# Patient Record
Sex: Female | Born: 1955 | Race: White | Hispanic: No | Marital: Married | Smoking: Current some day smoker
Health system: Southern US, Community
[De-identification: ages and names within clinical notes are randomized; demographics above are authoritative.]

## PROBLEM LIST (undated history)

## (undated) DIAGNOSIS — I639 Cerebral infarction, unspecified: Secondary | ICD-10-CM

## (undated) DIAGNOSIS — E785 Hyperlipidemia, unspecified: Secondary | ICD-10-CM

## (undated) DIAGNOSIS — I739 Peripheral vascular disease, unspecified: Secondary | ICD-10-CM

## (undated) DIAGNOSIS — C50919 Malignant neoplasm of unspecified site of unspecified female breast: Secondary | ICD-10-CM

## (undated) DIAGNOSIS — Z720 Tobacco use: Secondary | ICD-10-CM

## (undated) DIAGNOSIS — I2510 Atherosclerotic heart disease of native coronary artery without angina pectoris: Secondary | ICD-10-CM

## (undated) HISTORY — PX: BELOW KNEE LEG AMPUTATION: SUR23

## (undated) HISTORY — DX: Peripheral vascular disease, unspecified: I73.9

## (undated) HISTORY — PX: FEMORAL-POPLITEAL BYPASS GRAFT: SHX937

## (undated) HISTORY — PX: MASTECTOMY: SHX3

## (undated) HISTORY — PX: TUBAL LIGATION: SHX77

## (undated) HISTORY — PX: PR VEIN BYPASS GRAFT,AORTO-FEM-POP: 35551

## (undated) HISTORY — DX: Atherosclerotic heart disease of native coronary artery without angina pectoris: I25.10

## (undated) HISTORY — PX: CHOLECYSTECTOMY: SHX55

## (undated) HISTORY — DX: Cerebral infarction, unspecified: I63.9

## (undated) HISTORY — DX: Tobacco use: Z72.0

## (undated) HISTORY — PX: HERNIA REPAIR: SHX51

## (undated) HISTORY — DX: Malignant neoplasm of unspecified site of unspecified female breast: C50.919

## (undated) HISTORY — DX: Hyperlipidemia, unspecified: E78.5

---

## 1998-09-17 ENCOUNTER — Ambulatory Visit (HOSPITAL_COMMUNITY): Admission: RE | Admit: 1998-09-17 | Discharge: 1998-09-17 | Payer: Self-pay | Admitting: Gastroenterology

## 1999-06-10 ENCOUNTER — Encounter: Payer: Self-pay | Admitting: Neurological Surgery

## 1999-06-10 ENCOUNTER — Encounter: Admission: RE | Admit: 1999-06-10 | Discharge: 1999-06-10 | Payer: Self-pay | Admitting: Neurological Surgery

## 1999-08-20 ENCOUNTER — Encounter (HOSPITAL_COMMUNITY): Payer: Self-pay | Admitting: Oncology

## 1999-08-20 ENCOUNTER — Ambulatory Visit (HOSPITAL_COMMUNITY): Admission: RE | Admit: 1999-08-20 | Discharge: 1999-08-20 | Payer: Self-pay | Admitting: Oncology

## 2000-09-07 ENCOUNTER — Ambulatory Visit (HOSPITAL_COMMUNITY): Admission: RE | Admit: 2000-09-07 | Discharge: 2000-09-07 | Payer: Self-pay | Admitting: Oncology

## 2000-09-07 ENCOUNTER — Encounter (HOSPITAL_COMMUNITY): Payer: Self-pay | Admitting: Oncology

## 2000-10-24 ENCOUNTER — Emergency Department (HOSPITAL_COMMUNITY): Admission: EM | Admit: 2000-10-24 | Discharge: 2000-10-24 | Payer: Self-pay | Admitting: Emergency Medicine

## 2000-11-18 ENCOUNTER — Ambulatory Visit (HOSPITAL_COMMUNITY): Admission: RE | Admit: 2000-11-18 | Discharge: 2000-11-18 | Payer: Self-pay | Admitting: *Deleted

## 2000-11-18 ENCOUNTER — Encounter (INDEPENDENT_AMBULATORY_CARE_PROVIDER_SITE_OTHER): Payer: Self-pay | Admitting: Specialist

## 2001-02-28 ENCOUNTER — Ambulatory Visit (HOSPITAL_COMMUNITY): Admission: RE | Admit: 2001-02-28 | Discharge: 2001-02-28 | Payer: Self-pay | Admitting: Oncology

## 2001-02-28 ENCOUNTER — Encounter (HOSPITAL_COMMUNITY): Payer: Self-pay | Admitting: Oncology

## 2001-03-07 ENCOUNTER — Encounter: Payer: Self-pay | Admitting: Gastroenterology

## 2001-03-07 ENCOUNTER — Ambulatory Visit (HOSPITAL_COMMUNITY): Admission: RE | Admit: 2001-03-07 | Discharge: 2001-03-07 | Payer: Self-pay | Admitting: Gastroenterology

## 2001-04-25 ENCOUNTER — Encounter: Payer: Self-pay | Admitting: Orthopaedic Surgery

## 2001-04-25 ENCOUNTER — Ambulatory Visit (HOSPITAL_COMMUNITY): Admission: RE | Admit: 2001-04-25 | Discharge: 2001-04-25 | Payer: Self-pay | Admitting: Orthopaedic Surgery

## 2002-03-01 ENCOUNTER — Encounter (HOSPITAL_COMMUNITY): Payer: Self-pay | Admitting: Oncology

## 2002-03-01 ENCOUNTER — Ambulatory Visit (HOSPITAL_COMMUNITY): Admission: RE | Admit: 2002-03-01 | Discharge: 2002-03-01 | Payer: Self-pay | Admitting: Oncology

## 2002-10-12 ENCOUNTER — Ambulatory Visit (HOSPITAL_COMMUNITY): Admission: RE | Admit: 2002-10-12 | Discharge: 2002-10-12 | Payer: Self-pay | Admitting: Oncology

## 2002-10-12 ENCOUNTER — Encounter (HOSPITAL_COMMUNITY): Payer: Self-pay | Admitting: Oncology

## 2002-10-30 ENCOUNTER — Encounter (HOSPITAL_COMMUNITY): Payer: Self-pay | Admitting: Oncology

## 2002-10-30 ENCOUNTER — Ambulatory Visit (HOSPITAL_COMMUNITY): Admission: RE | Admit: 2002-10-30 | Discharge: 2002-10-30 | Payer: Self-pay | Admitting: Oncology

## 2003-02-15 ENCOUNTER — Encounter (HOSPITAL_COMMUNITY): Payer: Self-pay | Admitting: Oncology

## 2003-02-15 ENCOUNTER — Ambulatory Visit (HOSPITAL_COMMUNITY): Admission: RE | Admit: 2003-02-15 | Discharge: 2003-02-15 | Payer: Self-pay | Admitting: Oncology

## 2003-10-29 ENCOUNTER — Emergency Department (HOSPITAL_COMMUNITY): Admission: EM | Admit: 2003-10-29 | Discharge: 2003-10-29 | Payer: Self-pay | Admitting: *Deleted

## 2004-04-29 ENCOUNTER — Emergency Department (HOSPITAL_COMMUNITY): Admission: EM | Admit: 2004-04-29 | Discharge: 2004-04-30 | Payer: Self-pay | Admitting: Emergency Medicine

## 2004-08-06 ENCOUNTER — Ambulatory Visit: Payer: Self-pay | Admitting: Oncology

## 2005-02-26 ENCOUNTER — Ambulatory Visit (HOSPITAL_COMMUNITY): Admission: RE | Admit: 2005-02-26 | Discharge: 2005-02-26 | Payer: Self-pay | Admitting: Cardiovascular Disease

## 2005-03-17 ENCOUNTER — Ambulatory Visit (HOSPITAL_COMMUNITY): Admission: RE | Admit: 2005-03-17 | Discharge: 2005-03-17 | Payer: Self-pay | Admitting: Neurological Surgery

## 2005-03-23 ENCOUNTER — Encounter: Admission: RE | Admit: 2005-03-23 | Discharge: 2005-03-23 | Payer: Self-pay | Admitting: Cardiovascular Disease

## 2005-03-29 ENCOUNTER — Ambulatory Visit (HOSPITAL_COMMUNITY): Admission: RE | Admit: 2005-03-29 | Discharge: 2005-03-30 | Payer: Self-pay | Admitting: Cardiovascular Disease

## 2005-05-24 ENCOUNTER — Encounter: Admission: RE | Admit: 2005-05-24 | Discharge: 2005-05-24 | Payer: Self-pay | Admitting: Oncology

## 2005-08-16 ENCOUNTER — Ambulatory Visit: Payer: Self-pay | Admitting: Oncology

## 2005-09-26 ENCOUNTER — Encounter: Admission: RE | Admit: 2005-09-26 | Discharge: 2005-09-26 | Payer: Self-pay | Admitting: *Deleted

## 2005-10-20 ENCOUNTER — Ambulatory Visit: Payer: Self-pay | Admitting: Oncology

## 2006-03-06 ENCOUNTER — Inpatient Hospital Stay (HOSPITAL_COMMUNITY): Admission: EM | Admit: 2006-03-06 | Discharge: 2006-03-07 | Payer: Self-pay | Admitting: Emergency Medicine

## 2006-05-26 ENCOUNTER — Encounter: Admission: RE | Admit: 2006-05-26 | Discharge: 2006-05-26 | Payer: Self-pay | Admitting: Oncology

## 2006-06-09 ENCOUNTER — Encounter: Admission: RE | Admit: 2006-06-09 | Discharge: 2006-06-09 | Payer: Self-pay | Admitting: Oncology

## 2006-08-15 ENCOUNTER — Ambulatory Visit: Payer: Self-pay | Admitting: Oncology

## 2006-08-18 LAB — CBC WITH DIFFERENTIAL/PLATELET
EOS%: 2.2 % (ref 0.0–7.0)
Eosinophils Absolute: 0.2 10*3/uL (ref 0.0–0.5)
LYMPH%: 31.7 % (ref 14.0–48.0)
MCH: 30.5 pg (ref 26.0–34.0)
MCHC: 35.8 g/dL (ref 32.0–36.0)
MCV: 85.3 fL (ref 81.0–101.0)
MONO%: 7.1 % (ref 0.0–13.0)
NEUT#: 5.7 10*3/uL (ref 1.5–6.5)
Platelets: 240 10*3/uL (ref 145–400)
RBC: 4.48 10*6/uL (ref 3.70–5.32)

## 2006-08-18 LAB — COMPREHENSIVE METABOLIC PANEL
Alkaline Phosphatase: 67 U/L (ref 39–117)
Glucose, Bld: 176 mg/dL — ABNORMAL HIGH (ref 70–99)
Sodium: 135 mEq/L (ref 135–145)
Total Bilirubin: 0.5 mg/dL (ref 0.3–1.2)
Total Protein: 7.4 g/dL (ref 6.0–8.3)

## 2006-08-19 ENCOUNTER — Ambulatory Visit (HOSPITAL_COMMUNITY): Admission: RE | Admit: 2006-08-19 | Discharge: 2006-08-19 | Payer: Self-pay | Admitting: Oncology

## 2007-07-28 ENCOUNTER — Encounter: Admission: RE | Admit: 2007-07-28 | Discharge: 2007-07-28 | Payer: Self-pay | Admitting: Cardiovascular Disease

## 2007-08-01 ENCOUNTER — Ambulatory Visit: Payer: Self-pay | Admitting: Surgery

## 2007-08-01 ENCOUNTER — Observation Stay (HOSPITAL_COMMUNITY): Admission: RE | Admit: 2007-08-01 | Discharge: 2007-08-02 | Payer: Self-pay | Admitting: Cardiovascular Disease

## 2007-08-10 ENCOUNTER — Encounter: Payer: Self-pay | Admitting: Surgery

## 2007-08-10 ENCOUNTER — Inpatient Hospital Stay (HOSPITAL_COMMUNITY): Admission: RE | Admit: 2007-08-10 | Discharge: 2007-08-12 | Payer: Self-pay | Admitting: Surgery

## 2007-08-10 HISTORY — PX: CAROTID ENDARTERECTOMY: SUR193

## 2007-08-11 ENCOUNTER — Encounter: Payer: Self-pay | Admitting: Surgery

## 2007-08-15 ENCOUNTER — Ambulatory Visit: Payer: Self-pay | Admitting: Oncology

## 2007-08-21 ENCOUNTER — Ambulatory Visit: Payer: Self-pay | Admitting: Surgery

## 2007-09-11 ENCOUNTER — Ambulatory Visit: Payer: Self-pay | Admitting: Surgery

## 2007-10-09 ENCOUNTER — Ambulatory Visit: Payer: Self-pay | Admitting: Surgery

## 2007-10-19 ENCOUNTER — Ambulatory Visit: Payer: Self-pay | Admitting: Oncology

## 2007-11-20 ENCOUNTER — Ambulatory Visit: Payer: Self-pay | Admitting: Surgery

## 2007-11-23 ENCOUNTER — Encounter: Admission: RE | Admit: 2007-11-23 | Discharge: 2008-02-21 | Payer: Self-pay | Admitting: Ophthalmology

## 2008-02-01 ENCOUNTER — Ambulatory Visit (HOSPITAL_COMMUNITY): Admission: RE | Admit: 2008-02-01 | Discharge: 2008-02-01 | Payer: Self-pay | Admitting: Cardiovascular Disease

## 2008-03-09 IMAGING — CT CT CHEST W/O CM
2 of 3 series · 15 of 36 positions shown, 18 images · IV contrast (agent unspecified)
Comparison: none

CLINICAL DATA: History of right breast cancer.  Abnormal preop chest x-ray on 07/28/2007 with suggestion of a pulmonary nodule at the right base medially.
 CHEST CT WITHOUT CONTRAST:
TECHNIQUE: Multidetector CT imaging of the chest was performed following the standard protocol without IV contrast.

[Series 2: routine chest 5.0 st · axial · 0.82mm/px · z∈[+1067,+1347]mm · 12 of 66 slices shown, 15 images]
[im 5/66  mediastinal]
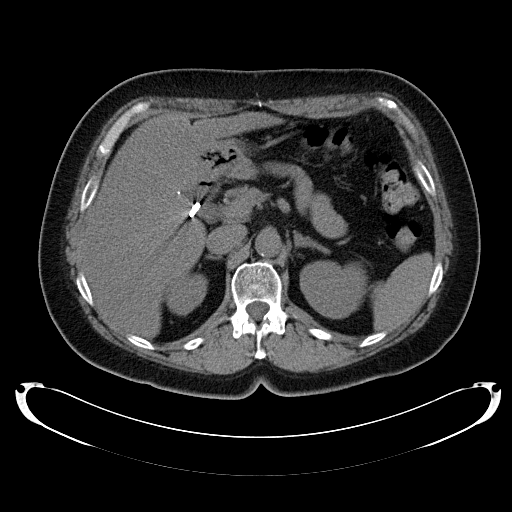
[im 5/66  lung]
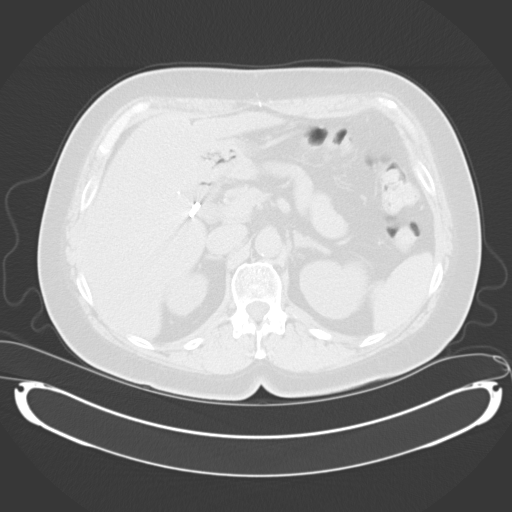
[im 10/66  lung]
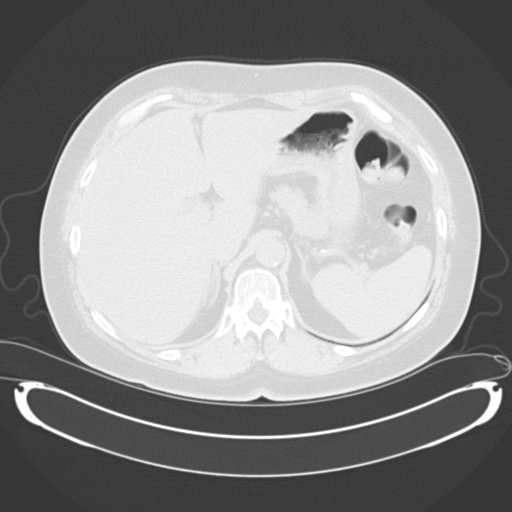
[im 15/66  lung]
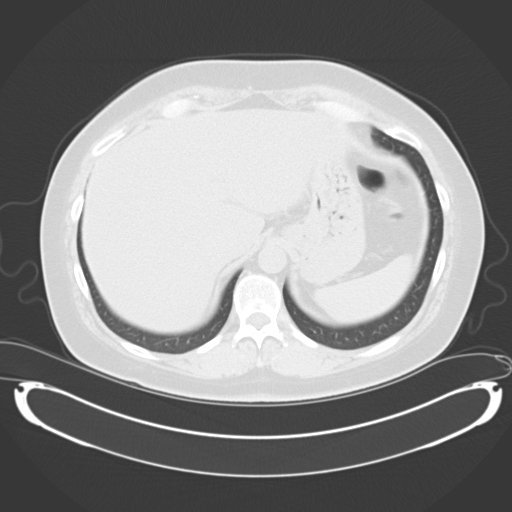
[im 20/66  lung]
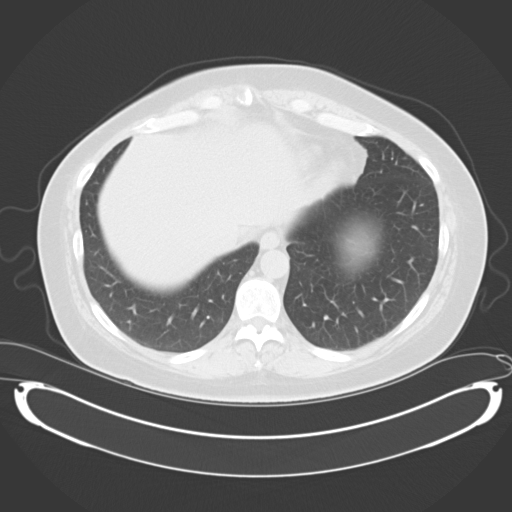
[im 25/66  mediastinal]
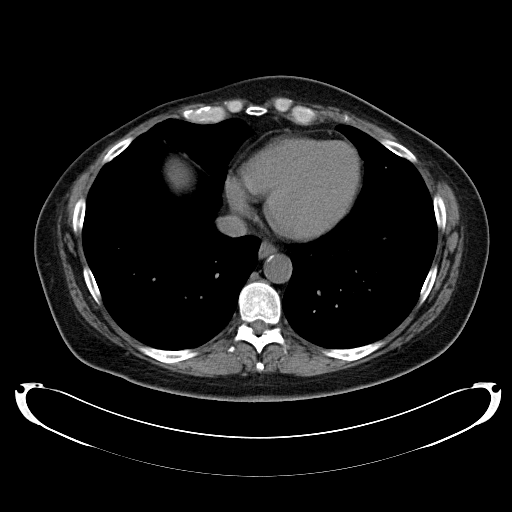
[im 25/66  lung]
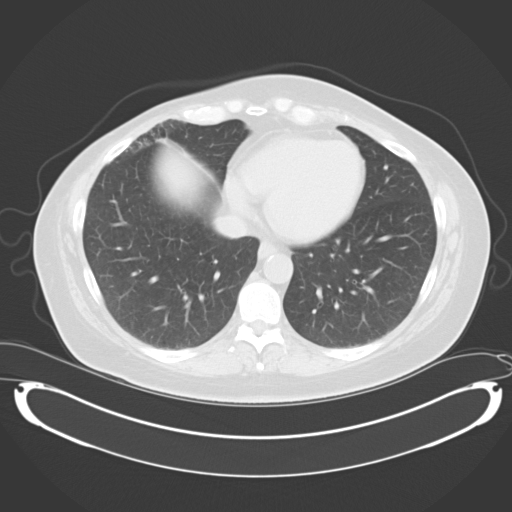
[im 29/66  lung]
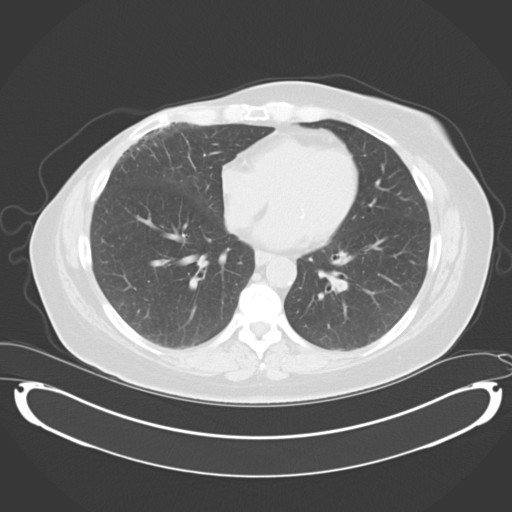
[im 37/66  lung]
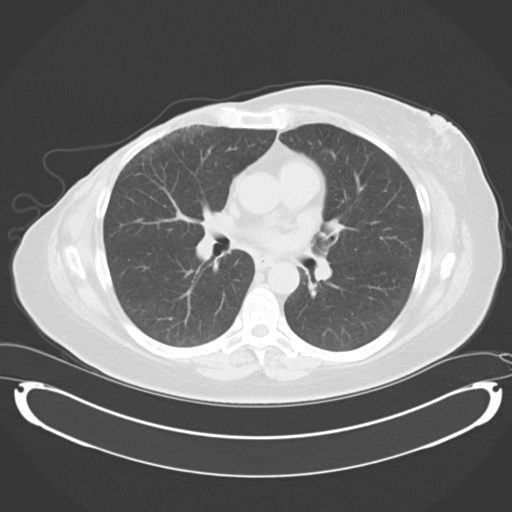
[im 41/66  lung]
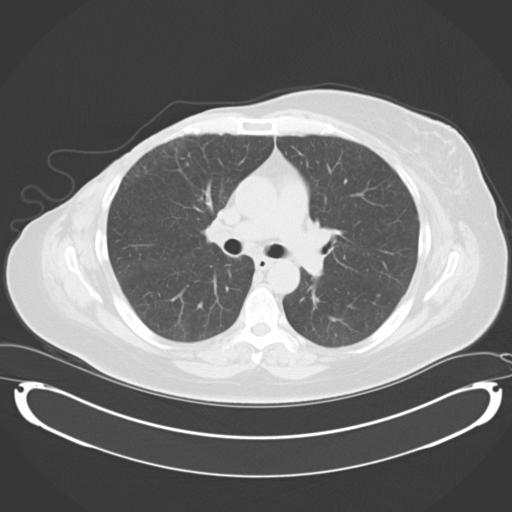
[im 46/66  mediastinal]
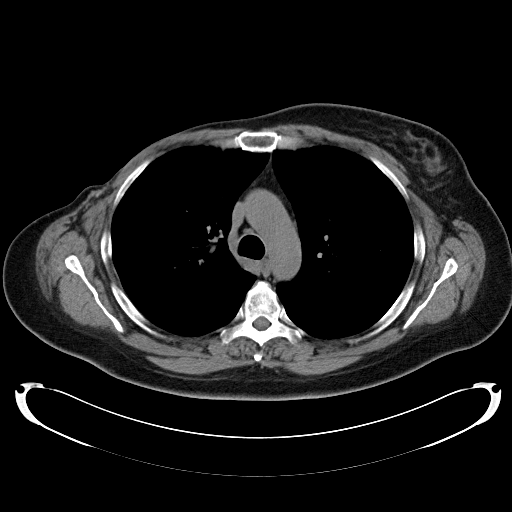
[im 46/66  lung]
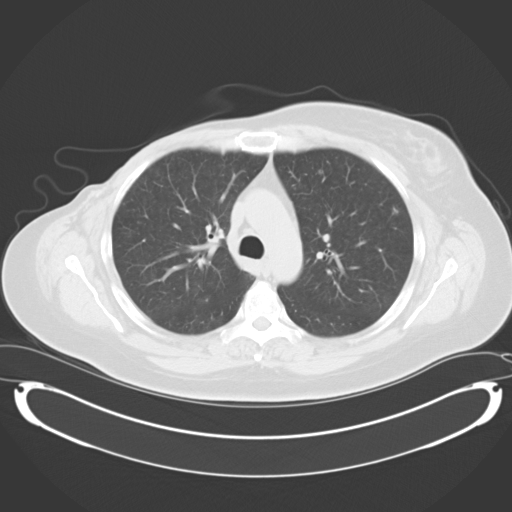
[im 51/66  lung]
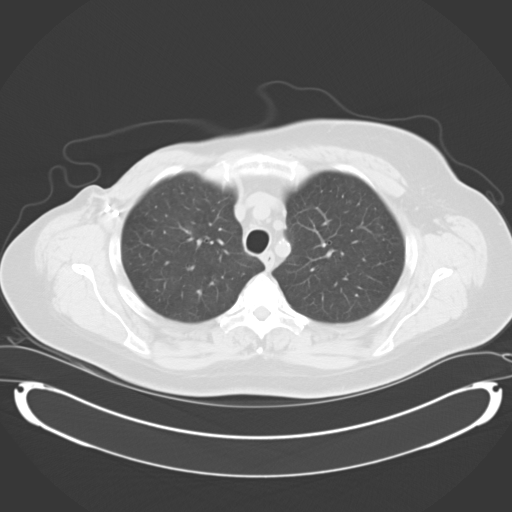
[im 56/66  lung]
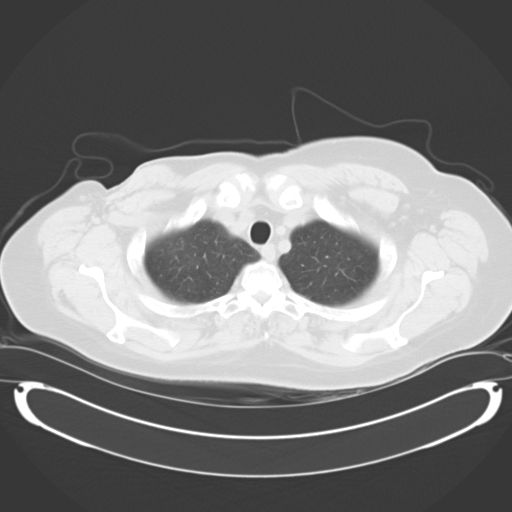
[im 61/66  lung]
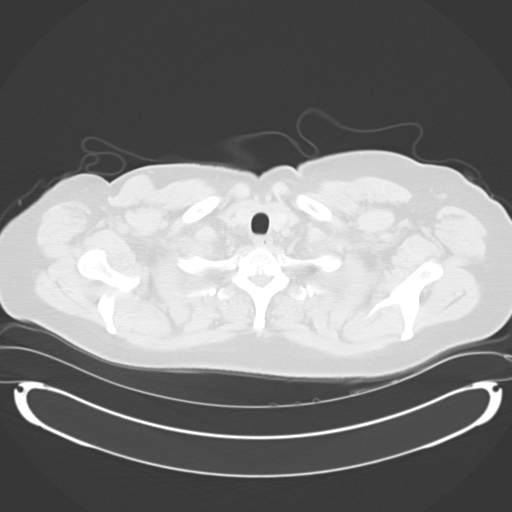

[Series 602: coronal · coronal · 0.82mm/px · 3 of 125 slices shown]
[im 25/125  lung]
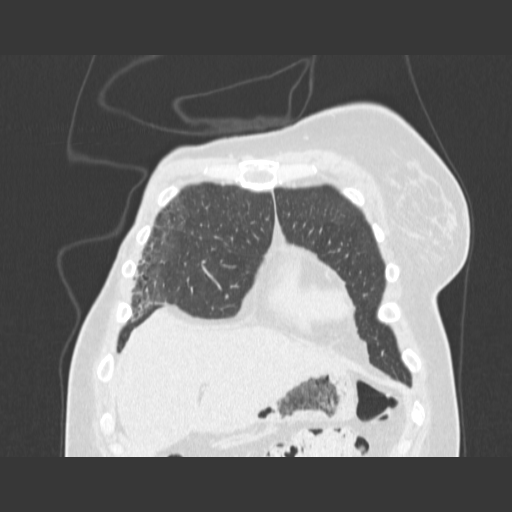
[im 50/125  lung]
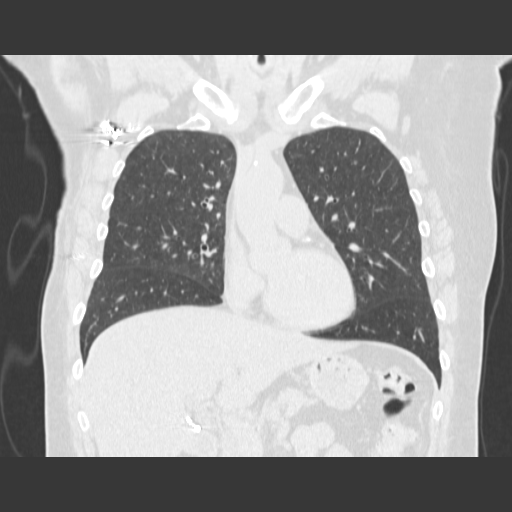
[im 75/125  lung]
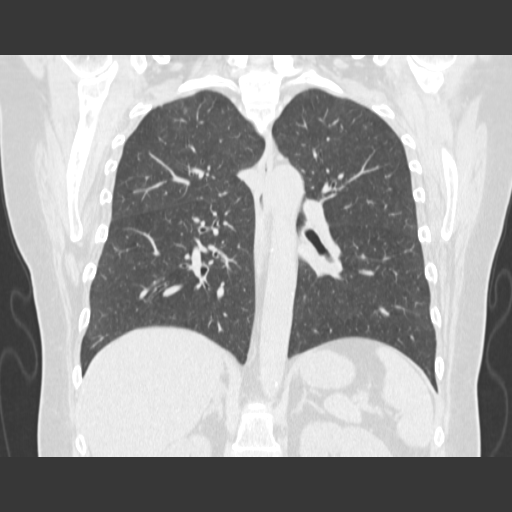

[15 of 36 positions shown; findings below may reference images not displayed]

FINDINGS: The scan demonstrates that the patient has multiple tiny bilateral upper lobe pulmonary nodules which are poorly defined.  The largest on each side is less than 4 mm.  There is some mild chronic interstitial and obstructive disease bilaterally, which is stable since the prior CT of the abdomen dated 02/26/2005.  The upper lobes were not visible on that exam and therefore, I cannot determine whether these small nodules were present previously or not.  There is a tiny calcified granuloma at the right lung base laterally measuring 2 mm in size.  There is no significant hilar or mediastinal adenopathy.  Heart size is normal.  There is some coronary artery calcification.  Right mastectomy.  No evidence significant bony abnormality.  The visualized portion of the upper abdomen is normal except for a small adenoma of the left adrenal gland, which is stable since 02/26/2005.  
 The density seen on chest x-ray at the right base medially is felt to represent a confluence of pulmonary veins.  There is no discrete nodule at the right lung base medially.
IMPRESSION: No evidence lung mass at the right lung base.  However, the patient does have multiple poorly defined small bilateral upper lobe pulmonary nodules, all of which are less than 4 mm in size.  These are probably benign inflammatory changes.  I recommend a 6-month follow-up chest CT without contrast because of the patient?s history of breast cancer.

## 2008-06-26 ENCOUNTER — Emergency Department (HOSPITAL_COMMUNITY): Admission: EM | Admit: 2008-06-26 | Discharge: 2008-06-26 | Payer: Self-pay | Admitting: Emergency Medicine

## 2009-03-18 ENCOUNTER — Encounter: Admission: RE | Admit: 2009-03-18 | Discharge: 2009-03-18 | Payer: Self-pay | Admitting: *Deleted

## 2010-08-24 ENCOUNTER — Ambulatory Visit: Payer: Self-pay | Admitting: Surgery

## 2010-08-31 ENCOUNTER — Ambulatory Visit (INDEPENDENT_AMBULATORY_CARE_PROVIDER_SITE_OTHER): Payer: Medicare Other | Admitting: Surgery

## 2010-08-31 DIAGNOSIS — I70219 Atherosclerosis of native arteries of extremities with intermittent claudication, unspecified extremity: Secondary | ICD-10-CM

## 2010-08-31 DIAGNOSIS — E1159 Type 2 diabetes mellitus with other circulatory complications: Secondary | ICD-10-CM

## 2010-09-01 NOTE — Assessment & Plan Note (Addendum)
OFFICE VISIT  [name], [name] DOB:  June 23, 1955                                       08/31/2010 CHART#:[id number]  The patient comes to me via the wound center and Dr. Tanda Rockers for evaluation of a left second toe ulcer which has been present since November.  The patient is previously known to me for left common femoral endarterectomy and patch angioplasty of her left common femoral and profunda femoral artery on 08/10/2007.  This was done in the setting of a nonhealing ulcer.  The patient has also undergone a right leg amputation.  She had previously undergone tibial bypass by Dr. Madilyn Fireman. The patient continues to smoke.  PHYSICAL EXAMINATION:  Vital signs:  Heart rate is 76, blood pressure 118/70, temperature is 98.1.  General:  She is resting comfortably, in no distress.  Respirations:  Nonlabored.  Cardiovascular:  Femoral pulses are difficult to feel.  Abdomen:  Soft.  Neurological:  She is intact.  She is status post right leg amputation.  There is dry eschar on the dorsum of her left second toe.  ABIs were performed at [facility].  These show 0.42 on the left.  She had monophasic femoral waveforms on the right.  At this point the next step in management is to proceed with arteriogram.  She is a former patient of Dr. Allyson Sabal.  We had discussed getting this scheduled for Dr. Allyson Sabal to do this.  However, he has dismissed her from the practice because she had been noncompliant. Therefore, I told her I would schedule her angiogram and intervene as needed.  This will be scheduled for Tuesday, March 27.  I will plan on accessing the right femoral artery.  If this turns out to be occluded I would get a CT scan that day to plan our next intervention.    Jorge Ny, MD Electronically Signed  VWB/MEDQ  D:  08/31/2010  T:  09/01/2010  Job:  [id number]  cc:   Jake Shark A. Tanda Rockers, M.D.

## 2010-09-08 ENCOUNTER — Ambulatory Visit (HOSPITAL_COMMUNITY)
Admission: RE | Admit: 2010-09-08 | Discharge: 2010-09-08 | Disposition: A | Discharge status: Home / Self Care | Payer: Medicare Other | Source: Ambulatory Visit | Attending: Surgery | Admitting: Surgery

## 2010-09-08 DIAGNOSIS — L97509 Non-pressure chronic ulcer of other part of unspecified foot with unspecified severity: Secondary | ICD-10-CM | POA: Insufficient documentation

## 2010-09-08 DIAGNOSIS — I7772 Dissection of iliac artery: Secondary | ICD-10-CM | POA: Insufficient documentation

## 2010-09-08 DIAGNOSIS — I739 Peripheral vascular disease, unspecified: Secondary | ICD-10-CM

## 2010-09-08 DIAGNOSIS — L98499 Non-pressure chronic ulcer of skin of other sites with unspecified severity: Secondary | ICD-10-CM

## 2010-09-08 LAB — GLUCOSE, CAPILLARY
Glucose-Capillary: 184 mg/dL — ABNORMAL HIGH (ref 70–99)
Glucose-Capillary: 154 mg/dL — ABNORMAL HIGH (ref 70–99)

## 2010-09-08 LAB — POCT I-STAT, CHEM 8
BUN: 11 mg/dL (ref 6–23)
Calcium, Ion: 1.22 mmol/L (ref 1.12–1.32)
Chloride: 104 mEq/L (ref 96–112)
Creatinine, Ser: 0.7 mg/dL (ref 0.4–1.2)
Glucose, Bld: 200 mg/dL — ABNORMAL HIGH (ref 70–99)
HCT: 43 % (ref 36.0–46.0)
Hemoglobin: 14.6 g/dL (ref 12.0–15.0)
Potassium: 4.2 mEq/L (ref 3.5–5.1)
Sodium: 139 mEq/L (ref 135–145)
TCO2: 27 mmol/L (ref 0–100)

## 2010-09-08 LAB — POCT ACTIVATED CLOTTING TIME: Activated Clotting Time: 187 seconds

## 2010-09-09 LAB — POCT ACTIVATED CLOTTING TIME
Activated Clotting Time: 146 seconds
Activated Clotting Time: 164 seconds

## 2010-09-13 NOTE — Op Note (Signed)
NAMESUNYA, [name]                 ACCOUNT NO.:  [id number]  MEDICAL RECORD NO.:  [id number]           PATIENT TYPE:  O  LOCATION:  [facility]                         FACILITY:  [facility]  PHYSICIAN:  Juleen China IV, MDDATE OF BIRTH:  07/21/1955  DATE OF PROCEDURE:  09/08/2010 DATE OF DISCHARGE:  09/08/2010                              OPERATIVE REPORT   PREOPERATIVE DIAGNOSIS:  Left leg ulcer.  POSTOPERATIVE DIAGNOSIS:  Left leg ulcer.  PROCEDURE PERFORMED: 1. Ultrasound access, left common femoral artery. 2. Abdominal aortogram. 3. Left lower extremity runoff. 4. Stent, left common iliac artery. 5. Stent, left external iliac artery. 6. Stent, aorta.  INDICATIONS:  This is a 55 year old female previously undergone multiple percutaneous interventions as well as a common femoral endarterectomy. She has developed a sore on her left second toe which has not healed. She comes in today for arteriogram.  PROCEDURE:  The patient was identified in the holding area, taken to room 8, and placed supine on the table.  The left groin was prepped and draped in the usual fashion.  A time-out was called.  The left femoral artery was evaluated with ultrasound and was patent.  A digital ultrasound image was acquired.  The artery was accessed under ultrasound guidance with a micropuncture needle and a 0.018 wire was then advanced into the iliac arterial system.  Under fluoroscopic visualization, a micropuncture sheath was placed with some difficulty.  I then advanced a Bentson wire into the iliac artery where I met resistance.  I was unable to advance a 5-French sheath over the wire.  I therefore used a 4-French end-hole catheter to gain access in the left common iliac artery. Contrast injection was performed and ultimately, I was able to navigate the wire into the aorta.  A wire exchange was performed and Amplatz SuperStiff wire was placed after dilating the subcutaneous tract with a 6-French  dilator.  A 5-French sheath was able to be advanced.  The Omni flush catheter was advanced into the aorta, placed at the level of L1, and abdominal aortogram was obtained.  Next, retrograde sheath injections were performed for left leg runoff.  FINDINGS:  Aortogram:  The visualized portions of the suprarenal abdominal aorta showed no significant stenosis.  There are single renal arteries bilaterally which are widely patent.  The infrarenal abdominal aorta has multiple areas of luminal irregularity without significant stenosis.  There is stenosis at the origin of the left common iliac artery.  The stents within the left common and external iliac artery are patent.  The left hypogastric artery is patent.  The right common iliac artery is patent.  The right hypogastric artery is patent.  The right external iliac artery is occluded.  The left common femoral artery is patent.  The superficial femoral artery is occluded.  The profunda femoral artery is patent.  There is reconstitution of the popliteal artery below the knee with three-vessel runoff.  At this point in time, the decision was made to intervene.  Initially in the left common iliac artery over an Amplatz SuperStiff wire, a 6-French bright tip sheath was placed into  the aorta.  I selected a 7 x 29 Cordis Genesis stent.  This was taken to nominal pressure and deployed.  Prior to doing this, I had checked a pressure gradient.  There was a 25-mm gradient between the aorta and the femoral artery.  The stent was deployed.  A retrograde injection was performed which showed resolution of the stenosis within the left common iliac artery.  I then evaluated the distal iliac artery.  There appeared to be what was thought to be a mobile thrombus.  I elected to trap this by placing Abbott Absolute Pro 8 x 40 stent in the left external iliac artery.  I then checked another pressure gradient and this time we had a 40-mm pressure grade between the  aorta and the common femoral artery.  A catheter was advanced back up into the iliac and a retrograde injection was performed which showed a dissection at the proximal end of the stent.  At this point, I elected to take an EV3, 12 x 40 stent deployed into the aorta and had a taper into the left common iliac artery.  After this maneuver, a contrast injection was performed which showed resolution of the dissection. There was also no evidence of a pressure gradient between the aorta and the left femoral artery.  At this point, decision was made to terminate the procedure.  Catheters and wires removed.  The patient was taken to the holding area for sheath pull.  IMPRESSION: 1. Proximal left common iliac artery stenosis, treated using a 7 x 29     Cordis Genesis self-expanding stent, subsequent thrombus     development within the left external iliac artery.  The thrombus     was trapped using an Absolute Pro 8 x 40 stent in the left external     iliac artery. 2. Dissection within the proximal portion of the left common iliac     stent, treated by placing an EV3, 12 x 40 stent beginning in the     aorta and tapering down into the left common iliac stent.     Jorge Ny, MD     VWB/MEDQ  D:  09/08/2010  T:  09/09/2010  Job:  [id number]  Electronically Signed by Arelia Longest IV MD on 09/13/2010 09:32:08 PM

## 2010-09-28 ENCOUNTER — Encounter (INDEPENDENT_AMBULATORY_CARE_PROVIDER_SITE_OTHER): Payer: Medicare Other

## 2010-09-28 ENCOUNTER — Ambulatory Visit (INDEPENDENT_AMBULATORY_CARE_PROVIDER_SITE_OTHER): Payer: Medicare Other | Admitting: Surgery

## 2010-09-28 DIAGNOSIS — I739 Peripheral vascular disease, unspecified: Secondary | ICD-10-CM

## 2010-09-28 DIAGNOSIS — I70219 Atherosclerosis of native arteries of extremities with intermittent claudication, unspecified extremity: Secondary | ICD-10-CM

## 2010-09-28 LAB — COMPREHENSIVE METABOLIC PANEL
AST: 14 U/L (ref 0–37)
Albumin: 3.8 g/dL (ref 3.5–5.2)
Alkaline Phosphatase: 93 U/L (ref 39–117)
CO2: 26 mEq/L (ref 19–32)
Chloride: 103 mEq/L (ref 96–112)
GFR calc Af Amer: >60 mL/min (ref >60)
GFR calc non Af Amer: >60 mL/min (ref >60)
Potassium: 4 mEq/L (ref 3.5–5.1)
Total Bilirubin: 0.5 mg/dL (ref 0.3–1.2)

## 2010-09-28 LAB — URINALYSIS, ROUTINE W REFLEX MICROSCOPIC
Bilirubin Urine: NEGATIVE
Glucose, UA: NEGATIVE mg/dL
Nitrite: NEGATIVE
Specific Gravity, Urine: 1.01 (ref 1.005–1.030)
pH: 5 (ref 5.0–8.0)

## 2010-09-28 LAB — URINE MICROSCOPIC-ADD ON

## 2010-09-28 LAB — CBC
HCT: 42.8 % (ref 36.0–46.0)
MCV: 87.3 fL (ref 78.0–100.0)
Platelets: 265 10*3/uL (ref 150–400)
RBC: 4.91 MIL/uL (ref 3.87–5.11)
WBC: 11.2 10*3/uL — ABNORMAL HIGH (ref 4.0–10.5)

## 2010-09-28 LAB — DIFFERENTIAL
Basophils Absolute: 0.1 10*3/uL (ref 0.0–0.1)
Basophils Relative: 1 % (ref 0–1)
Eosinophils Absolute: 0.2 10*3/uL (ref 0.0–0.7)
Eosinophils Relative: 1 % (ref 0–5)
Monocytes Absolute: 0.6 10*3/uL (ref 0.1–1.0)

## 2010-09-28 LAB — POCT CARDIAC MARKERS

## 2010-09-29 NOTE — Assessment & Plan Note (Signed)
OFFICE VISIT  [name], [name] DOB:  [birth year]                                       09/28/2010 CHART#:[id number]  This patient comes back in today for follow-up of a left fourth toe ulcer.  She has recently undergone arteriogram where she had stenting of her iliac system which was complicated by dissection requiring stenting from her aorta and left external iliac stenting.  She comes back today for follow-up.  She is doing very well.  PHYSICAL EXAMINATION:  Her heart rate is 88, blood pressure 115/68, respiratory rate 20.  General:  She is well-appearing in no distress. Abdomen:  Soft, nontender.  She has a 2+ palpable femoral pulse on the left.  Respirations are nonlabored.  The left fourth toe has an eschar on the anterior side which is rather deep.  There is no evidence of infection.  ABIs were performed today.  They are 0.42.  This is essentially unchanged prior to her intervention.  ASSESSMENT/PLAN:  Left toe ulcer.  PLAN:  Patient is going to come back to see me in 3 weeks.  I am going to get x-rays to rule out osteo on the toe.  She may in fact need to have this toe amputated.  However, I not sure if she has adequate circulation for this to heal.  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  I discussed the possibility of toe amputation and possibility of a bypass graft for limb salvage.  She is now willing to do this at this time so I am going to have her to come back to see me in 3 weeks.    Jorge Ny, MD Electronically Signed  VWB/MEDQ  D:  09/28/2010  T:  09/29/2010  Job:  [id number]

## 2010-10-06 ENCOUNTER — Other Ambulatory Visit: Payer: Self-pay | Admitting: Surgery

## 2010-10-06 ENCOUNTER — Ambulatory Visit
Admission: RE | Admit: 2010-10-06 | Discharge: 2010-10-06 | Disposition: A | Discharge status: Home / Self Care | Payer: Medicare Other | Source: Ambulatory Visit | Attending: Surgery | Admitting: Surgery

## 2010-10-06 DIAGNOSIS — M79609 Pain in unspecified limb: Secondary | ICD-10-CM

## 2010-10-19 ENCOUNTER — Ambulatory Visit: Payer: Medicare Other | Admitting: Surgery

## 2010-10-26 ENCOUNTER — Ambulatory Visit (INDEPENDENT_AMBULATORY_CARE_PROVIDER_SITE_OTHER): Payer: Medicare Other | Admitting: Surgery

## 2010-10-26 DIAGNOSIS — I70219 Atherosclerosis of native arteries of extremities with intermittent claudication, unspecified extremity: Secondary | ICD-10-CM

## 2010-10-27 NOTE — Cardiovascular Report (Signed)
[name], [name]                 ACCOUNT NO.:  [id number]   MEDICAL RECORD NO.:  [id number]          PATIENT TYPE:  OUT   LOCATION:  [facility]S                         FACILITY:  [facility]   PHYSICIAN:  Nanetta Batty, M.D.   DATE OF BIRTH:  Jul 16, 1955   DATE OF PROCEDURE:  07/31/2007  DATE OF DISCHARGE:  07/31/2007                            CARDIAC CATHETERIZATION   [name] is an unfortunate 55 year old married white female with history  of moderate CAD by catheterization, multiple risk factors including  tobacco abuse, hypertension, hyperlipidemia and diabetes as well as  PVOD.  She had a right AKA and stenting of her left common external  iliac arteries as well as superficial femoral artery.  Dopplers have  shown occluded left superficial femoral artery with progression of  disease in the distal left external iliac artery with critical limb  ischemia and rest pain.  She underwent diagnostic coronary arteriography  this morning revealing three-vessel disease with normal LV function  which will be treated medically.  She presents now for abdominal  aortography with left lower extremity runoff.   1. Pigtail catheter was placed into the abdominal aorta, and a      midstream abdominal aortogram as well as pelvic shot was obtained      using digital subtraction technique.  There was a 50% right renal      artery stenosis.  There was moderate infrarenal abdominal aortic      atherosclerotic narrowing.  2. Right lower extremity:  Occluded right external iliac artery  3. Left lower extremity:  Patent left common external iliac artery      stents with some moderate in-stent restenosis within the distal      portion of the external iliac artery stent.  There was high-grade      disease in the common femoral just after the stented segment with a      40 mm pullback gradient.  The superficial femoral artery was      occluded.  The profunda femoris and femoris widely patent and      filled the  popliteal artery.  There was three-vessel runoff.   IMPRESSION:  [name] has high-grade left common femoral artery stenosis  jeopardizing profunda femoral collaterals.  I believe this is high risk  for percutaneous intervention given its location was brought to the  femoral head and flexion point.  I believe she would be best served by  open endarterectomy patch angioplasty plus or minus fem-pop bypass  grafting.  Sheath was removed, and pressure was held to the groin to  achieve hemostasis. The patient left the lab in stable condition.  I  will have Dr. Carroll Kinds review the films to offer a surgical  consultation.  The patient left the lab in stable condition.      Nanetta Batty, M.D.  Electronically Signed     JB/MEDQ  D:  08/01/2007  T:  08/02/2007  Job:  [id number]   cc:   2nd [facility]  [facility]  Samuel Jester

## 2010-10-27 NOTE — Assessment & Plan Note (Signed)
OFFICE VISIT  [name], [name] DOB:  1955/07/04                                       10/26/2010 CHART#:[id number]  Patient comes back today for a wound check.  I last saw her 3 weeks ago. I am following for a left fourth toe ulcer.  She has recently had stenting of her iliac system, which was complicated by dissection requiring stenting from her aorta and to her left external iliac artery. Her most recent ABI was 0.42 on the left.  Her right leg is surgically absent.  I sent her for x-ray of the left toe.  This was concerning for osteomyelitis.  She is seeing Dr. Tanda Rockers in the interim and is being treated at the wound center.  She is not having any fever or chills.  PHYSICAL EXAMINATION:  Her heart rate is 78, blood pressure 136/71, temperature is 97.7.  Generally, she is well-appearing in no distress. HEENT:  Within normal limits.  Lungs are clear bilaterally. Cardiovascular:  Pedal pulses are not palpable.  The left fourth toe has been debrided by Dr. Tanda Rockers.  There is no significant change.  There is no surrounding erythema or edema.  I discussed today with patient the possibility of proceeding with a left femoral to below knee popliteal bypass graft.  We are going to give this another 3 weeks to see if we see any signs of improvement.  However, she is most likely going to require below-knee pop bypass and subsequent fourth toe amputation.  In the interim, I am also sending her to [facility] for full cardiac workup should she need to undergo surgical revascularization.  I will plan on seeing her back in 3 weeks.  I am going to have her vein mapped prior to her seeing me.    Jorge Ny, MD Electronically Signed  VWB/MEDQ  D:  10/26/2010  T:  10/27/2010  Job:  [id number]  cc:   Jake Shark A. Tanda Rockers, M.D.

## 2010-10-27 NOTE — Consult Note (Signed)
NAMECOLETTA, [name]                 ACCOUNT NO.:  [id number]   MEDICAL RECORD NO.:  [id number]          PATIENT TYPE:  OUT   LOCATION:  CATS                         FACILITY:  [facility]   PHYSICIAN:  Juleen China IV, MDDATE OF BIRTH:  1956/02/23   DATE OF CONSULTATION:  08/01/2007  DATE OF DISCHARGE:  07/31/2007                                 CONSULTATION   REASON FOR CONSULTATION:  Left leg ulcer.   HISTORY:  This is a 55 year old female whom I am seeing at the request  of Dr. Nanetta Batty for evaluation of her left leg ulcer.  The patient  has longstanding peripheral vascular disease as well as coronary  disease.  She has a history of stenting in her left iliac arterial  system extending into her common femoral artery as well as in her  superficial femoral artery.  Duplex evaluation indicated in-stent  stenosis and therefore Dr. Allyson Sabal performed an arteriogram earlier today.  Arteriogram findings included severe disease within the left common  femoral artery and occlusion of the left superficial femoral artery with  reconstitution of the popliteal artery behind the knee.  Due to the  location of the disease, she was not a good candidate for percutaneous  revascularization, and he asked me to see her.  The patient has an ulcer  on her left leg, which has been there for approximately 1 month's time  and has not healed.  Of note, she also has a right below-knee  amputation.  The patient has not had any fevers or chills.  She does  continue to smoke although she states she has now quit.   REVIEW OF SYSTEMS:  GENERAL:  Negative for weight gain or weight loss.  No fevers, no chills.  VASCULAR:  Rest pain and a nonhealing ulcer.  NEURO:  Occasional lightheadedness and dizziness.  CARDIAC:  Positive  for chest pain and shortness of breath.  GI:  No bloody stools.  All  other systems are negative.   PAST MEDICAL HISTORY:  1. Peripheral vascular disease.  2. Right breast cancer status  post chemo and mastectomy.  3. History of severe coronary disease.  4. Tobacco abuse.  5. Diabetes.   PAST SURGICAL HISTORY:  1. Stenting of her left superficial femoral artery and external iliac      and common femoral artery.  2. Right below-knee amputation.  3. Right femoral popliteal bypass graft.  4. Mastectomy.  5. Tubal ligation.  6. Cholecystectomy.  7. Hernia repair.  8. Diabetes.   FAMILY HISTORY:  Positive for lung cancer in her mother, father has  coronary disease, grandfather died of an MI at age 36.   SOCIAL HISTORY:  She is married with 3 children.  Positive for tobacco  of 1 pack a day.   MEDICATIONS:  1. Lasix 20 mg per day.  2. Humulin 70/30.  3. Plavix 75 mg per day.  4. Aspirin 81 mg per day.  5. Prandin 2 mg p.r.n.  6. Atenolol 25 mg per day.  7. Meperidine 50 mg p.r.n.  8. Hyoscyamine 0.125 mg p.r.n.  9. Alprazolam 0.5 mg p.r.n.  10.Lunesta 3 mg p.r.n.  11.Nitroglycerin sublingual p.r.n.  12.Demerol p.r.n.  13.Xopenex nebulizers.   ALLERGIES:  1. LIDOCAINE.  2. NOVOCAINE.  3. ALTACE.  4. ANTACID.  5. NEURONTIN.  6. LYRICA.  7. ZETIA.  8. SONATA.  9. ZOCOR.  10.TETRACYCLINE.  11.STATIN.  12.LOVAZA.  13.TRICOR.  14.LODINE.  15.CODEINE.   PHYSICAL EXAMINATION:  VITAL SIGNS:  Blood pressure 140/68.  She is  afebrile.  Heart rate 78.  She is  well-appearing in no acute distress.  HEENT:  She is normocephalic and atraumatic.  Pupils are equal.  Sclerae  anicteric.  CARDIOVASCULAR:  Regular rate and rhythm.  No murmurs, rubs, or gallops.  LUNGS:  Clear to auscultation bilaterally.  ABDOMEN:  Soft and nontender.  EXTREMITIES:  She has a right below-knee amputation.  Left leg shows a  small punctate ulcer approximately 2 mm in diameters.  No evidence of  infection.  NEURO:  Cranial nerves II to XII are grossly intact.  PSYCH:  She is alert and oriented x3.   DIAGNOSTIC STUDIES:  I reviewed her arteriogram with Dr. Allyson Sabal.   ASSESSMENT:   A 55 year old with nonhealing left leg ulcer and rest pain.   PLAN:  I had a long conversation with the patient and her family  regarding options for treatment.  Based on the location of the disease,  she is not a good candidate for percutaneous revascularization.  At this  time, I feel like she would do best with an operation.  If she were to  undergo bypass, there would need to be femoral to below-knee bypass.  Dr. Allyson Sabal does not want me to use vein even that she is likely going to  need coronary revascularization in the near future.  For that reason,  the only option is a cortex graft, which below the knee has variable  long term results and given her age, this may not be a great option.  Since her ulcer is not very large and may in fact be decreasing in size,  I in conjunction with Dr Allyson Sabal have decided to perform a common femoral  endarterectomy and to clear out the disease within her profunda femoral  artery hoping that this will enable her to heal her ulcer and help her  with her rest pain.  If this is not the case, she would likely need to  undergo further surgery, which would be a below-knee bypass.  The  patient is scheduled to undergo surgery next Thursday.  I recommended  she stop her Plavix beginning this Sunday.  Risks and benefits were  discussed including the long-term results and risk of infection and  bleeding.  The patient is aware of these risks and we are all set to  proceed.           ______________________________  V. Charlena Cross, MD  Electronically Signed     VWB/MEDQ  D:  08/02/2007  T:  08/03/2007  Job:  [id number]

## 2010-10-27 NOTE — Cardiovascular Report (Signed)
[name], [name]                 ACCOUNT NO.:  [id number]   MEDICAL RECORD NO.:  [id number]          PATIENT TYPE:  OBV   LOCATION:  3707                         FACILITY:  [facility]   PHYSICIAN:  Nanetta Batty, M.D.   DATE OF BIRTH:  1956/05/21   DATE OF PROCEDURE:  DATE OF DISCHARGE:  08/02/2007                            CARDIAC CATHETERIZATION   Ms. [name] is a 55 year old married white female with a history of CAD and  PVD.  She was catheterized in 1999 and was found to have moderate 3-  vessel disease, normal LV function.  Medical therapy was recommended.  She also has severe PVOD status post right AKA.  She has had stenting of  her left common and external iliac artery as well as left SFA.  Dopplers  suggest SFA occlusion with progression of disease in her external iliac  artery stent versus common femoral.  Right duplex ultrasound with  critical limb ischemia and rest pain.  She has also had several episodes  of chest pain with a Myoview performed late last year that showed mild  inferior ischemia.  Positive continued tobacco abuse, hypertension,  hyperlipidemia, and diabetes.  She presents now for diagnostic coronary  arteriography to define her anatomy and rule out ischemic etiology  followed by peripheral angiography and left lower extremity runoff with  potential endovascular therapy for her critical limb ischemia.   DESCRIPTION OF PROCEDURE:  The patient was brought to the second floor  of the [facility] Lab in the postabsorptive state.  She was  premedicated with p.r.n. Valium, IV Versed, and fentanyl.  Her left  groin was prepped and shaved in the usual sterile fashion.  1% Xylocaine  was used for local anesthesia.  A 6-French sheath was inserted into the  left femoral artery under direct visualization using standard Seldinger  technique and a 35 wire.  A 6-French right-left Judkins diagnostic  catheter as well as 6-French pigtail catheter were used for selective  cholangiography, left ventriculography, subselective left internal  mammary artery angiography.  Visipaque dye was used for the entirety of  the case.  Aortic, left ventricular, and pulmonic pressures were  recorded.   HEMODYNAMIC RESULTS:  1. Left ventricle systolic pressure 154, end-diastolic pressure 15.  2. There is no pullback gradient noted.   SELECTIVE CORONARY ANGIOGRAPHY:  1. Left main normal.  2. LAD:  LAD gave off a large first diagonal branch with 50% to 60%      proximal stenosis.  There is 50% to 60% segmental stenosis just      after the diagonal takeoff and the junction between the proximal      mid third of the LAD.  3. Left circumflex:  This vessel had a 70% stenosis which was      eccentric on a bend.  The first OM had a 70% segmental proximal      stenosis.  The distal PL had an 80% segmental proximal stenosis.  4. Right coronary artery:  Occluded proximally with left-to-right      collaterals.  5. Left ventriculography:  RAO left ventriculogram  is performed using      25 mL of Visipaque dye at 12 mL per second.  The overall LVEF was      estimated at greater than 60% without focal wall motion      abnormalities.  6. Left internal mammary artery:  Sessile subselectively visualized      was widely patent.  Suitable for use during coronary bypass      grafting if required.   IMPRESSION:  Ms. [name] has 3-vessel disease with occluded right, left-to-  right collaterals, and moderate circumflex disease.  At this point, we  will continue medical therapy.      Nanetta Batty, M.D.  Electronically Signed     JB/MEDQ  D:  08/01/2007  T:  08/02/2007  Job:  [id number]   cc:   Sickle Cell Nurse  Cardiac Catheterization Lab  [facility]  Samuel Jester

## 2010-10-27 NOTE — Discharge Summary (Signed)
[name], [name]                 ACCOUNT NO.:  [id number]   MEDICAL RECORD NO.:  [id number]          PATIENT TYPE:  INP   LOCATION:  2006                         FACILITY:  [facility]   PHYSICIAN:  Juleen China IV, MDDATE OF BIRTH:  22-Oct-1955   DATE OF ADMISSION:  08/10/2007  DATE OF DISCHARGE:  08/13/2007                               DISCHARGE SUMMARY   DISCHARGE DIAGNOSES:  1. Peripheral arterial disease with occlusive disease in the left      common femoral, profunda and superficial femoral arteries.  2. Status post endovascular repair of abdominal aortic aneurysm.  3. Hypertension.  4. Insulin-dependent diabetes mellitus.   PROCEDURE:  On August 10, 2006, left femoral endarterectomy by Dr.  Myra Gianotti.   DISCHARGE MEDICATIONS:  1. Humulin 70/30 insulin 60 units q.a.m., 65 units q.p.m.  2. Plavix 75 mg p.o. daily.  3. Aspirin 81 mg p.o. daily.  4. Atenolol 25 mg p.o. daily.  5. Lasix 40 mg p.o. daily.  6. Nitro-Quick 0.4 mg p.r.n.  7. Alprazolam 0.5 mg p.o. p.r.n.  8. Hyoscyamine 0.125 mg p.r.n.  9. Darvocet-N 100 one p.o. q.4 h. p.r.n. pain.   DISPOSITION:  She is being discharged home anticipated August 13, 2006.   WOUND CARE:  She is instructed to clean her groin wound gently with soap  and water.  She is to observe the wound for changes consistent of  increased redness, drainage and/or pain.  She was to observe for a  temperature greater than 101.2 degrees.  She is to call for any  questions regarding the procedure, wounds or wound care.   FOLLOW UP:  She is given a return appointment with Dr. Myra Gianotti in 4  weeks with ABIs.  Our office will call and schedule the appointment.   HISTORY OF PRESENT ILLNESS:  For complete details, please refer to the  typed history and physical.  Briefly, this very pleasant, 55 year old  woman has peripheral arterial disease.  She has previously in the past  undergone stenting of her aorta and common iliacs.  She was seen in  referral  by Dr. Myra Gianotti with an occlusive disease in her left common  femoral artery.  She was having left lower leg ischemia.  Dr. Myra Gianotti  evaluated her and found her to be a suitable candidate for a femoral  endarterectomy.  She was informed of the risks and benefits of the  procedure.  After careful consideration, she elected to proceed with  surgery.   HOSPITAL COURSE:  Preoperative workup was completed as an outpatient.  She was admitted through same day surgery.  She underwent the  aforementioned femoral endarterectomy.  For complete details, please  refer to the type operative report.  She was returned to the  postanesthesia care unit and extubated.  Following stabilization, she  was  transferred to a bed on a surgical stepdown unit.  She was observed  overnight.  She was transferred to a bed on a surgical convalescent  floor.  Her wound was carefully followed.  She was walking and improving  with physical therapy.  She was to  be discharged on anticipated August 13, 2007.      Wilmon Arms, PA      V. Charlena Cross, MD  Electronically Signed    KEL/MEDQ  D:  08/11/2007  T:  08/12/2007  Job:  [id number]

## 2010-10-27 NOTE — Assessment & Plan Note (Signed)
OFFICE VISIT   [name], [name]  DOB:  07-18-1955                                       09/11/2007  CHART#:[id number]   REASON FOR VISIT:  Followup.   HISTORY:  This is a 55 year old female who is status post left common  femoral endarterectomy with patch angioplasty extending from common  femoral down on to her profunda femoral artery.  This is done for a  nonhealing ulcer.  She had an uneventful postoperative course.  When I  saw her at her first postoperative visit she had some separation of the  superior aspect of her wound, I opened it and she has been packing it.  She comes back in today for followup.  She has been doing very well  without complaint.  On exam, her wound is healing nicely.  She has  extruded some of her suture, and therefore these were removed today.  She is going to continue packing the wound.  She is going to see me in 1  month.   Jorge Ny, MD  Electronically Signed   VWB/MEDQ  D:  09/11/2007  T:  09/12/2007  Job:  [id number]   cc:   Nanetta Batty, M.D.

## 2010-10-27 NOTE — Assessment & Plan Note (Signed)
OFFICE VISIT   LATESSA, TILLIS J  DOB:  May 07, 1956                                       10/09/2007  CHART#:[id number]   REASON FOR VISIT:  Followup.   HISTORY:  This is a 55 year old female who underwent left common femoral  endarterectomy and patch angioplasty of her common femoral and profunda  femoral artery on August 10, 2007 for a nonhealing ulcer.  She has had  the incision in her groin open and her requiring packing.  She comes  back in today for further evaluation and followup.   EXAM:  Her blood pressure 125/74, pulse 101, respirations 18.  Her wound  has slight separation in the superior aspect.  However, this is nearly  healed.  Ulcer on the foot has also healed.  At this time she should  continue with dressing changes until the wound closes up, which should  be within the next 3 to 4 weeks if not sooner.   I am referring her back to Dr. Nanetta Batty for further management of  her peripheral vascular disease, given that he had initially seen her.  I told her that I will not schedule her followup appointment with me.  However, if she has any questions she can get back in touch with me.   Jorge Ny, MD  Electronically Signed   VWB/MEDQ  D:  10/09/2007  T:  10/10/2007  Job:  [id number]   cc:   Nanetta Batty, M.D.

## 2010-10-27 NOTE — Discharge Summary (Signed)
NAMEJAZMYN, [name]                 ACCOUNT NO.:  [id number]   MEDICAL RECORD NO.:  [id number]          PATIENT TYPE:  OUT   LOCATION:  CATS                         FACILITY:  [facility]   PHYSICIAN:  Nanetta Batty, M.D.   DATE OF BIRTH:  Aug 15, 1955   DATE OF ADMISSION:  08/01/2007  DATE OF DISCHARGE:  08/02/2007                               DISCHARGE SUMMARY   HISTORY OF PRESENT ILLNESS:  [name] is a 55 year old female  patient of Dr. Nanetta Batty who has known a peripheral vascular  disease and coronary artery disease.  She had recently been having more  claudication.  She had Dopplers that revealed that she probably had  restenosis in her left femoral stent and left external iliac artery  stent that she was brought in the hospital for PV angiogram.  She also  complained about chest pain.  She had previous known coronary artery  disease.  Thus, she was going to have a combined cath and PV angiogram.  Her cath revealed she had 100% RCA.  She had progressed disease in her  circumflex and LAD also.  She had 70% blockage, 50-60 in the proximal to  mid circumflex, 70% in her OM1 and 80% in her OM2.  She also had 50-60%  in her diagonal at the bifurcation of her LAD, and her LAD also had 50-  60% at the bifurcation.  Dr. Allyson Sabal recommended medical treatment.  He  thought her left circumflex was high risk because it was on a bend.  She  went on to have a PV angiogram.  She had patent left external iliac  stent.  She had an 80% left common femoral artery stent and a patent  profunda.  She had a three-vessel runoff.  Dr. Allyson Sabal felt she needed a  left DCF enterectomy with patch angioplasty and possibly a left fem pop  bypass graft.  She was seen by Dr. Myra Gianotti, and felt that she should  undergo left common femoral artery enterectomy and a below-the-knee fem-  pop.  Her surgery will be scheduled for Thursday of next week on  August 02, 2007.  Her labs looked satisfactory, blood  pressure was  109/52, pulse was 75 and she was ready for discharge home.  Her  hemoglobin was 14, hematocrit 40.  Her WBCs 8.3, platelets 204.  Sodium  138, potassium 2.8, BUN 7, creatinine 0.62, glucose was 234   DISCHARGE DIAGNOSES:  1. Claudication with abnormal Dopplers.  2. Progressed peripheral vascular disease with 80% left circumflex      artery occlusion.  She has three-vessel runoff on that leg with      recommendations to undergo left common femoral artery enterectomy      and below the knees fem-pop.  Her left EIA stent was patent.  3. A CBC with progressed coronary disease to be treated medically at      this time.  4. Continued tobacco smoking.  5. Hyperlipidemia.  Unable to take any statins, lovastatin, Tricor,      etc.  6. Insulin-dependent diabetes mellitus with diabetic retinopathy and  neuropathy.  7. Right below-the-knee amputation.  8. Chronic pain.  9. Normal ejection fraction by cath August 01, 2007.   DISCHARGE MEDICATIONS:  1. She should stop taking her Plavix.  2. Furosemide 20 mg daily.  3. Humulin 70/30, 60 units twice a day and sliding-scale insulin.  4. Aspirin 81 mg two per day.  5. Atenolol 25 mg daily.  6. Nitroglycerin p.r.n. if needed for chest pain.  7. Prandin 2 mg when needed.  8. Meperidine 50 mg when needed.  9. Hyoscyamine 0.125 when needed.  10.Alprazolam 0.5 when needed.  11.Lunesta 3 mg at bedtime when needed.  12.Demerol when needed.  13.Xopenex nebulizers as used previously.   DISCHARGE INSTRUCTIONS:  She will return to see Dr. Allyson Sabal in the  [place] office on August 08, 2007 at 11 o'clock.  She is to have  her surgery done that following Thursday, and she should be no strenuous  activities, lifting, pushing, pulling x1 week, and she will call if she  has any problems with her groin.      Lezlie Octave, N.P.      Nanetta Batty, M.D.  Electronically Signed    BB/MEDQ  D:  08/02/2007  T:  08/03/2007  Job:   [id number]   cc:   Jorge Ny, MD  [facility]

## 2010-10-27 NOTE — Op Note (Signed)
[name], [name]                 ACCOUNT NO.:  [id number]   MEDICAL RECORD NO.:  [id number]          PATIENT TYPE:  INP   LOCATION:  3314                         FACILITY:  [facility]   PHYSICIAN:  Juleen China IV, MDDATE OF BIRTH:  1956/03/10   DATE OF PROCEDURE:  08/10/2007  DATE OF DISCHARGE:                               OPERATIVE REPORT   PREOPERATIVE DIAGNOSIS:  Left leg ulcer.   POSTOPERATIVE DIAGNOSES:  Left leg ulcer.   PROCEDURE PERFORMED:  1. Left common femoral endarterectomy.  2. Patch angioplasty, left common femoral and profunda femoral artery.   SURGEON:  1. Charlena Cross, MD   ASSISTANT:  Wilmon Arms, PA   ANESTHESIA:  General.   BLOOD LOSS:  1 L.   DRAINS:  None.   CULTURES:  None.   SPECIMENS:  None.   FINDINGS:  Near occlusion of the common femoral artery with significant  posterior plaque.  Extensive mobilization of the profunda femoral artery  was performed to get to the end of the plaque.  There was a stent in the  proximal common femoral artery, which prohibited clamping of the common  femoral artery, thus full occlusion was obtained using a balloon  catheter.   INDICATIONS:  This is a 55 year old female who has undergone multiple  percutaneous interventions including stenting of her iliac artery as  well as her superficial femoral arteries by Dr. Nanetta Batty.  The  patient recently was studied with angiogram due to new left leg ulcer.  Angiographic findings included high-grade stenosis within the common  femoral artery as well as profunda femoral artery.  Her superficial  femoral artery was occluded.  We had an extensive conversation in  conjunction with Dr. Allyson Sabal detailing the options for the patient.  At  this point, we have decided to proceed with common femoral  endarterectomy without distal bypass given that the patient does not  have a vein, which can be used.  Risks and benefits of the procedure  were discussed with the  patient.  Informed consent was signed.   PROCEDURE:  The patient was identified in the holding area and taken to  room 6.  She was placed supine on the table.  General endotracheal  anesthesia was administered.  The patient was prepped and draped in  standard sterile fashion.  Timeout was called and antibiotics were  given.  A #10 blade was used to make a longitudinal incision over the  anticipated location of the common femoral artery.  Bovie cautery was  used to dissect through the subcutaneous tissues.  The femoral sheath  was identified and then opened anterior to the common femoral artery.  Two StarClose closure devices were found on the artery and there was  significant scar tissue in this region.  I was able to mobilize the  common femoral artery and ultimately get circumferential control and  passed a vessel loop.  Dissection of the common femoral artery was  continued up to the inguinal ligament.  There was stent palpable within  the proximal common femoral artery at the level of the  inguinal  ligament.  Side branches were encircled with red vessel loops.  Next, I  continued dissection distal.  The bifurcation of the superficial femoral  and profunda femoral artery was identified.  Superficial femoral artery  was identified and encircled with vessel loop.  I then continued further  dissection down to the profunda femoral artery.  The profunda femoral  artery was heavily calcified.  I then continued to mobilize to try to  get distal to the calcification, and trying to mobilize the distal  profunda femoral artery, the branch of the profunda femoral vein was  transected.  In order to obtain hemostasis, I had to skeletonize the  deep femoral vein.  Significant blood loss occurred as we were unable to  get control over the bleeding vein.  Once the femoral vein had been  skeletonized, I was able to identify the branch where the bleeding  occurred.  This was encircled and ligated  between 2-0 silk ties.  With  this maneuver, the bleeding was controlled.  At this point in time, the  patient was systemically heparinized.  A #11 blade was used to open the  femoral artery.  In order to obtain proximal control, a #6 Fogarty  balloon was passed retrograde up into the iliac artery.  The balloon was  then insufflated and this enabled me to have hemostasis.  A bulldog  clamp was placed distally in the profunda femoral artery.  Pott scissors  were used to open the artery  proximally up to the stent, which was in  the proximal common femoral artery.  I then opened the common femoral  artery distally down onto the profunda femoral artery.  A Penfield  elevator was used to perform endarterectomy.  The proximal plaque was  then removed and I obtained a good endpoint at the level of the stent.  In attempting to perform endarterectomy of the distal artery, it was  noted to be heavily calcified.  I had to extend my skin incision  distally to try to further mobilize the profunda femoral artery to get  distal to the posterior plaque.  After the profunda femoral artery had  been thoroughly mobilized, the arteriotomy of the profunda femoral  artery was extended with Pott scissors down distal to the posterior  plaque.  With this, I was able to complete the profunda femoral  endarterectomy.  I was able to back-flush the profunda femoral artery to  ensure that there was adequate back bleeding which there was.  Heparinized saline was used to irrigate the endarterectomized plane.   Next, I selected a Dacron patch to perform patch angioplasty.  Total  length of the patch was approximately 8 cm.  Patch angioplasty was then  performed using a running 6-0 Prolene suture.  Prior to completion of  the patch, the artery was flushed in antegrade and retrograde fashion.  The balloon was removed and a Heaney clamp was placed on the proximal  common femoral artery and the repair was completed.  There  was a tear  from the endarterectomy in the posterior side of the common femoral  artery, which required oversewing with 6-0 Prolene.  There was also an  area at the distal anastomosis which was bleeding which required  placement of a felt pledget for hemostasis.  Once the repair sutures  were in place, all clamps were released.  There was adequate hemostasis.  I evaluated the profunda femoral artery with Doppler and there was a  multi-phasic signal.  The  patient was given 25 mg of protamine to  reverse the heparin.  All bleeding tissues were cauterized.  There was  good hemostasis.  The wound was then copiously irrigated.  The femoral  sheath was then closed using a running 3-0 Vicryl.  The subcutaneous  tissue was closed in another layer of running 3-0 Vicryl.  A third layer  of interrupted 3-0 Vicryl was placed.  The skin was closed with running  4-0 Vicryl.  Dermabond was then used to close the skin.  Sterile  dressing was applied.  Doppler  was used to evaluate the pedal pulses.  She had a good signal in her  dorsalis pedis as well as a more faint signal in the posterior tibial  artery.  The patient was then successfully awakened from anesthesia and  taken to the recovery room in stable condition.           ______________________________  V. Charlena Cross, MD  Electronically Signed     VWB/MEDQ  D:  08/10/2007  T:  08/11/2007  Job:  [id number]

## 2010-10-27 NOTE — Assessment & Plan Note (Signed)
OFFICE VISIT   BEV, DRENNEN J  DOB:  Oct 13, 1955                                       08/21/2007  CHART#:[id number]   HISTORY:  This is a patient I saw at the request of Nanetta Batty for  left leg rest pain and ulcer on 08/10/07.  She underwent left common  femoral artery endarterectomy and patch angioplasty extending from her  common femoral into her profunda femoral artery.  The patient had an  uneventful postoperative course and was discharged home.  She comes back  in today for followup.   Her activity has not been what I would have hoped for; however, her pain  has been better controlled.  She does still have some eschar from her  sores on her feet; however, these appear to be slightly improved.  She  does have some mild separation at the superior aspect of her inguinal  incision.  This was debrided in the office today and then packed with 4  x 4s.  I am scheduling her to come to visit for wet-to-dry dressing  changes twice daily.   She will follow up in my office in three weeks for a wound check.   Jorge Ny, MD  Electronically Signed   VWB/MEDQ  D:  08/21/2007  T:  08/21/2007  Job:  [id number]   cc:   Nanetta Batty, M.D.

## 2010-10-30 NOTE — Op Note (Signed)
[facility]  Patient:    [name], [name]                        MRN: [id number] Proc. Date: 11/18/00 Adm. Date:  [id number] Attending:  Marina Gravel B                           Operative Report  PREOPERATIVE DIAGNOSES:       1. Menorrhagia.                               2. Suggestion of polyps on pelvic ultrasound and                                  saline ultrasound.  POSTOPERATIVE DIAGNOSES:      1. Menorrhagia.                               2. Suggestion of polyps on pelvic ultrasound and                                  saline ultrasound.  PROCEDURE:                    Hysteroscopy dilation and curettage.  SURGEON:                      Marina Gravel, M.D.  ANESTHESIA:                   MAC and 10 cc of 3% chloroprocaine.  ESTIMATED BLOOD LOSS:         Less than 50 cc.  DISTENTION MEDIUM:            Sorbitol, deficit 50 cc.  SPECIMEN:                     Endometrial curettings.  FINDINGS:                     The cavity was poorly visualized due to bleeding (patient had started bleeding again prior to surgery.) Examination under anesthesia: retroverted normal size uterus with good descent. The patient would be a good TVH candidate if needed in the future.  COMPLICATIONS:                None.  INDICATIONS:                  The patient with a history of menorrhagia to the extent of having to wear an incontinence diaper several days per month. The patient also has a history of peripheral vascular disease and coronary artery disease. She is here today for treatment and diagnosis of menorrhagia with preoperative findings suggestive of endometrial polyps.  DESCRIPTION OF PROCEDURE:     The patient was taken to the operating room and MAC anesthesia obtained. She was placed in the ski position and exam under anesthesia with above findings noted. A rectovaginal exam confirmed the above findings.  The patient was prepped and draped in standard  fashion. The bladder was emptied with straight catheter.  The speculum was inserted and 3 cc of 3%  chloroprocaine was injected and the patient was observed for two minutes with no evidence of allergic reaction (patient has a history of allergic reaction to other types of local anesthesia.) Subsequently, 10 cc was injected as a paracervical block in standard fashion. The uterus was sounded and was approximately 7 cm in depth, retroverted. A single-tooth tenaculum was placed on the cervix and the cervix easily dilated to #31.  The working hysteroscope was then flushed with Sorbitol and inserted into the cavity. Despite numerous attempts, adequate visualization of the cavity could not be obtained due to bleeding as outline above.  Therefore, the hysteroscope was removed and simple D&C performed. The uterus was curetted with smooth curet until a gritty texture was noted throughout. Also, the Randall stone forceps were passed but no polyp-like tissue was removed. The uterus was then curetted one more time and a gritty texture was noted throughout. Attempted the hysteroscope for one more pass but again, visualization was limited, therefore, the procedure was terminated.  The patient tolerated the procedure well and there were no complications. She was taken to the recovery room awake, alert, and in stable condition. DD:  11/18/00 TD:  11/18/00 Job: [id number]

## 2010-10-30 NOTE — Discharge Summary (Signed)
[name], [name]                 ACCOUNT NO.:  [id number]   MEDICAL RECORD NO.:  [id number]          PATIENT TYPE:  OIB   LOCATION:  6522                         FACILITY:  [facility]   PHYSICIAN:  Nanetta Batty, M.D.   DATE OF BIRTH:  Oct 25, 1955   DATE OF ADMISSION:  03/29/2005  DATE OF DISCHARGE:  03/30/2005                                 DISCHARGE SUMMARY   ADMITTING DIAGNOSES:  1.  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  2.  Coronary artery disease with ejection fraction 50%.  3.  Adult-onset diabetes mellitus, insulin-dependent.  4.  Hyperlipidemia.  5.  Obesity.  6.  Ongoing tobacco use.   DISCHARGE DIAGNOSES:  1.  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  2.  Coronary artery disease with ejection fraction 50%.  3.  Adult-onset diabetes mellitus, insulin-dependent.  4.  Hyperlipidemia.  5.  Obesity.  6.  Ongoing tobacco use.   PROCEDURES:  Peripheral vascular angiogram with PCA and stenting of the left  common iliac and left SFA.   BRIEF HISTORY:  The patient is a 55 year old white female with peripheral  vascular disease.  She was seen in August of 2006.  She has a history of  noncritical coronary artery disease by catheterization in 1999.  She has had  peripheral vascular disease with left SFA, PTA, and stenting in 1998, a  right fem-pop bypass graft which ultimately led to right BKA.  She continues  to use tobacco one pack per day.  She has a history of hypertension and  insulin-dependent diabetes, status post right-sided mastectomy with  chemotherapy and radiation for breast carcinoma.  A Persantine Cardiolite  was performed February 24, 2005 which was normal.  She was admitted at this  time for claudication and peripheral vascular disease.   MEDICATIONS ON ADMISSION:  This is taken from the patient's list.  1.  Lunesta 3 mg daily.  2.  Nitroglycerin sublingual p.r.n.  3.   Demerol 50 mg p.r.n.  4.  Pulmicort 200 mg b.i.d.  5.  Humulin sliding scale insulin.  6.  Humalog 50/50 50 units in the a.m., 55 units in the p.m.  7.  Prandin 2 mg daily.  8.  Plavix 75 mg daily.  9.  Metoprolol 50 mg b.i.d.  10. Aspirin 81 mg daily.  11. Triamterene/hydrochlorothiazide 37.5/25 one daily.  12. Zelnorm 60 mg p.r.n.  13. Tramadol 50 mg p.r.n.  14. Alprazolam 0.5 mg p.r.n.   ALLERGIES:  LIDOCAINE, NOVOCAINE, ALTACE, ATACAND, TETRACYCLINE, LODINE,  ZOCOR, CODEINE, NEURONTIN, LYRICA.   For further history and physical see the dictated note.   HOSPITAL COURSE:  Patient was admitted to the catheterization laboratory,  underwent angiogram.  This showed severe stenosis in the left iliac.  A 100%  occlusion was dilated and two stents were placed with no occlusion at the  end of the procedure.  Also had a 90% SFA.  This was dilated and stent was  placed.  Patient tolerated procedure well.  The following day  she was doing  well.  Laboratories were normal.  BUN was 9.  Creatinine was 0.6.  Electrolytes were normal.  Glucose was running 250-270 range.  Hemoglobin  13.6, hematocrit 39, white count 9.5, platelets are normal.  She was seen by  Dr. Elsie Lincoln.  She had a posterior tibial pulse on the left.  It was his  opinion she could be discharged home.  She has been repeatedly encouraged to  discontinue smoking and that is part of her discharge instructions.  She  will continue her same medications.  She is to maintain a low fat, diabetic  diet, maintain her sugars between 90 and 120 and follow up with Dr. Allyson Sabal in  approximately two to three weeks.  Will schedule her for Dopplers and then  to see Dr. Allyson Sabal.  The office will call.  She is also to follow up with Dr.  Samuel Jester.  She is to maintain her sugars between 90-120 and call Dr.  Charm Barges if they are not adequately corrected with her current insulin dose.      [name], P.A.      Nanetta Batty, M.D.   Electronically Signed    WDJ/MEDQ  D:  03/30/2005  T:  03/30/2005  Job:  [id number]   cc:   Samuel Jester  Fax: [phone]

## 2010-10-30 NOTE — Cardiovascular Report (Signed)
NAMEGIULLIANA, MCROBERTS                 ACCOUNT NO.:  [id number]   MEDICAL RECORD NO.:  [id number]          PATIENT TYPE:  OIB   LOCATION:  2899                         FACILITY:  [facility]   PHYSICIAN:  Nanetta Batty, M.D.   DATE OF BIRTH:  [birth year]   DATE OF PROCEDURE:  03/29/2005  DATE OF DISCHARGE:                              CARDIAC CATHETERIZATION   HISTORY OF PRESENT ILLNESS:  [name] is a 55 year old female with a history  of CAD and PVOD.  She had noncritical CAD by cath in 1999.  She has had left  SFA PTA and stenting in 1998 and right fem-pop bypass grafting which  ultimately failed resulting in a right BKA.  Other problems include  continued tobacco abuse, hyperlipidemia, and insulin-dependent diabetes.  She had a non-ischemic Cardiolite, 06/04.  She is complaining of chronic  right thigh, hip, and stump pain and fullness.  A CT angio revealed an  occluded right external iliac artery.  She presents now for angiography and  potential intervention for critical limb ischemia.   PROCEDURE DESCRIPTION:  The patient was brought to the sixth floor [facility] in the postabsorbtive state.  She was premedicated with oral Valium.  Her left groin was prepped and  draped in the usual sterile fashion.  Procaine was used for general  anesthesia because of lidocaine allergy.  A short 6-French sheath was  inserted into the left femoral artery using standard Seldinger technique.  Because of difficulty advancing the wire, a hand shot was performed which  revealed dissection of the left common and external iliac artery.  A central  access was obtained with a Glidewire.  A 5-French pigtail catheter was used  for abdominal aortography with limited bi-femoral runoff down to the thighs.  Visipaque dye was used for the entirety of the case.  Aortic pressures were  monitored during the case.   ANGIOGRAPHIC RESULTS:  1.  Abdominal aorta:      1.  Renal  arteries showed 50-60% proximal right renal artery stenosis.      2.  Mild to moderate narrowing of the infrarenal abdominal aorta.  2.  Right lower extremity:      1.  Patent right common iliac artery.      2.  Total right external iliac and common femoral artery.  There was          very limited blood flow below the inguinal ligament.  3.  Left lower extremity:  Fairly normal left common iliac artery with      dissection and poor flow beyond the internal/external bifurcation.      There was a 90% segmental proximal left SFA stenosis.   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  It is hard to tell whether this occurred  because of the [facility] wire or with injection.  However, it is compromising  her left lower extremity circulation.  Central access was obtained.  We will  proceed with PTA and stenting for limb salvage.  The patient received 2500 units of heparin intravenously.  PTA was performed  with a 5 x 8 Powerflex and overlapping inflations in the left common and  external iliac artery down to the common femoral artery.  An 8 x 6 March  stent was then deployed in the proximal to mid left common iliac artery  extending into the proximal left external iliac artery.  An overlapping 7/8  March was then deployed down to the common femoral artery level above the  femoral head.  This entire segment was postdilated  with a 6 x 8 Powerflex  at 2 atmospheres resulting in stent expansion.  Prestent angiography  revealed widely patent left iliac segment down to the distal common femoral.  NEST was approximately 150.  The sheath was removed on the table and  pressure on the groin was used to obtain hemostasis.  The patient left the  lab in stable condition.  She was on aspirin and Plavix prior to the  procedure.  The  patient will be hydrated overnight, discharged in the morning.  Hopefully  this procedure will have improved  cross collateral circulation to her right  lower extremity.  She has very limited surgical options otherwise.  She left  the lab in stable condition.      Nanetta Batty, M.D.  Electronically Signed     JB/MEDQ  D:  03/29/2005  T:  03/29/2005  Job:  [id number]   cc:   [facility]  [address]  [place] [address]   Samuel Jester  Fax: [phone]
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
## 2010-10-30 NOTE — Discharge Summary (Signed)
NAMEPEYTYN, TRINE                 ACCOUNT NO.:  [id number]   MEDICAL RECORD NO.:  [id number]          PATIENT TYPE:  INP   LOCATION:  2024                         FACILITY:  [facility]   PHYSICIAN:  Nicki Guadalajara, M.D.     DATE OF BIRTH:  1955/09/12   DATE OF ADMISSION:  03/05/2006  DATE OF DISCHARGE:  03/07/2006                                 DISCHARGE SUMMARY   HOSPITAL COURSE:  Ms. Voorheis is a 55 year old with a known PVD and coronary  artery disease.  She is also diabetic, has hypertension and has obesity.  She complained about initial headache and that it radiated to her left  shoulder and left chest.  She had some nausea and diaphoresis, but no  shortness of breath.  She was admitted to rule out an MI and seen by Dr.  Tresa Endo on March 06, 2006.  He decided to start an ACE inhibitor and  Tricor and he questioned if she did not need a cardiac catheterization.  The  following morning, she wanted to go home and she eventually walked out of  the hospital without telling anybody before we were able to see her.      Lezlie Octave, N.P.    ______________________________  Nicki Guadalajara, M.D.    BB/MEDQ  D:  04/15/2006  T:  04/16/2006  Job:  [id number]

## 2010-11-04 ENCOUNTER — Encounter: Payer: Self-pay | Admitting: Cardiovascular Disease

## 2010-11-05 ENCOUNTER — Encounter: Payer: Self-pay | Admitting: Cardiovascular Disease

## 2010-11-05 ENCOUNTER — Ambulatory Visit (INDEPENDENT_AMBULATORY_CARE_PROVIDER_SITE_OTHER): Payer: Medicare Other | Admitting: Cardiovascular Disease

## 2010-11-05 DIAGNOSIS — G459 Transient cerebral ischemic attack, unspecified: Secondary | ICD-10-CM

## 2010-11-05 DIAGNOSIS — I739 Peripheral vascular disease, unspecified: Secondary | ICD-10-CM

## 2010-11-05 DIAGNOSIS — I2510 Atherosclerotic heart disease of native coronary artery without angina pectoris: Secondary | ICD-10-CM

## 2010-11-05 DIAGNOSIS — F17200 Nicotine dependence, unspecified, uncomplicated: Secondary | ICD-10-CM

## 2010-11-05 DIAGNOSIS — Z01810 Encounter for preprocedural cardiovascular examination: Secondary | ICD-10-CM

## 2010-11-05 DIAGNOSIS — IMO0001 Reserved for inherently not codable concepts without codable children: Secondary | ICD-10-CM

## 2010-11-05 NOTE — Assessment & Plan Note (Signed)
Counseled for less than 10 minutes no motivation to quit.   

## 2010-11-05 NOTE — Assessment & Plan Note (Signed)
Continue asa and plavix.  Carotid duplex

## 2010-11-05 NOTE — Assessment & Plan Note (Signed)
Needs cors cleard before major vascular surgery but ok to have simple toe amputation before this if needed.  F/U wound center Dr Tanda Rockers

## 2010-11-05 NOTE — Patient Instructions (Addendum)
Need stress test and carotid US to be scheduled. F/U Dr Eden Emms after tests. F/U Dr Tanda Rockers for left toe ulcer Will discuss with Dr Myra Gianotti if she can have vascular surgery after stress test

## 2010-11-05 NOTE — Progress Notes (Signed)
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  Moderate LAD and circumflex disease.  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  Needs fem pop bypass to avoid LBKA.  Limited mobility but no angina.  Describes "TIA;s with numbness on left side of face.  No focal deficits.  Compliant with ASA and Plavix.  No recent cardiac or vascular studies since 2009.  Long discussion about smoking and fact that she will fail her fempop and require amputation.  She does not appear to be motivated to quit.  Denies palpiations, mild chronic dypnea.  No syncope, diaphoresis.  She has had breast CA and was told her chest would never heal with CABG and she is terrified of this  ROS: Denies fever, malais, weight loss, blurry vision, decreased visual acuity, cough, sputum, SOB, hemoptysis, pleuritic pain, palpitaitons, heartburn, abdominal pain, melena, lower extremity edema, claudication, or rash.   General: Affect appropriate Healthy:  appears stated age HEENT: normal Neck supple with no adenopathy JVP normal no bruits no thyromegaly Lungs clear with no wheezing and good diaphragmatic motion Heart:  S1/S2 no murmur,rub, gallop or click PMI normal Abdomen: benighn, BS positve, no tenderness, no AAA no bruit.  No HSM or HJR Distal pulses intact with no bruits No edema Neuro non-focal Skin warm and dry No muscular weakness  Medications Current Outpatient Prescriptions  Medication Sig Dispense Refill  . APIDRA SOLOSTAR 100 UNIT/ML injection DAILY SLIDING SCALE      . aspirin 81 MG tablet Take 81 mg by mouth daily.        Marland Kitchen atenolol (TENORMIN) 25 MG tablet Take 1 tablet by mouth Daily.      . B-D INS SYR ULTRAFINE 1CC/31G 31G X 5/16" 1 ML MISC       . B-D ULTRAFINE III SHORT PEN 31G X 8 MM MISC       . furosemide (LASIX) 20 MG tablet Take 20 mg by mouth daily.         Marland Kitchen HUMULIN 70/30 70-30 % injection 55 UNITS IN THE MORNING, 65 UNITS AT NIGHT      . hyoscyamine (LEVSIN, ANASPAZ) 0.125 MG tablet Take 0.125 mg by mouth every 6 (six) hours as needed.        . meperidine (DEMEROL) 50 MG tablet Take 50 mg by mouth every 6 (six) hours as needed.        Marland Kitchen NITROSTAT 0.4 MG SL tablet Take by mouth. AS NEEDED FOR CHEST PAINS EVERY 5 MINUTES UP TO 3 DOSES      . NON FORMULARY Alprozolam... 0.5mg  1 daily as needed       . PLAVIX 75 MG tablet Take 1 tablet by mouth Daily.        Allergies Altace; Atacand; Codeine; Iohexol; Lodine; Lyrica; Neurontin; Other; Soap; Tetracyclines & related; Tricor; and Vitamin d analogs  Family History: Family History  Problem Relation Age of Onset  . Lung cancer Mother   . Heart disease Father   . Heart attack Maternal Grandfather     Social History: History   Social History  . Marital Status: Married    Spouse Name: N/A    Number of Children: N/A  . Years of Education: N/A   Occupational History  . Not on file.   Social History Main Topics  . Smoking status: Current Everyday Smoker  .  Smokeless tobacco: Not on file  . Alcohol Use:   . Drug Use:   . Sexually Active:    Other Topics Concern  . Not on file   Social History Narrative  . No narrative on file    Electrocardiogram:  NS[facility] normal ECG  Assessment and Plan

## 2010-11-05 NOTE — Assessment & Plan Note (Signed)
With continued smoking and known 3VD in 2009 may have surgical disease.  In regard to preop clearance the issue is ischemic burden.  Will order myovue.  Continue asa plavix and beta blocker

## 2010-11-18 ENCOUNTER — Other Ambulatory Visit: Payer: Self-pay | Admitting: Cardiovascular Disease

## 2010-11-18 DIAGNOSIS — G459 Transient cerebral ischemic attack, unspecified: Secondary | ICD-10-CM

## 2010-11-19 ENCOUNTER — Ambulatory Visit (HOSPITAL_COMMUNITY): Payer: Medicare Other | Attending: Cardiovascular Disease | Admitting: Radiology

## 2010-11-19 ENCOUNTER — Ambulatory Visit (INDEPENDENT_AMBULATORY_CARE_PROVIDER_SITE_OTHER): Payer: Medicare Other | Admitting: *Deleted

## 2010-11-19 ENCOUNTER — Ambulatory Visit (INDEPENDENT_AMBULATORY_CARE_PROVIDER_SITE_OTHER): Payer: Medicare Other | Admitting: Cardiovascular Disease

## 2010-11-19 ENCOUNTER — Other Ambulatory Visit: Payer: Self-pay | Admitting: *Deleted

## 2010-11-19 ENCOUNTER — Encounter: Payer: Self-pay | Admitting: Cardiovascular Disease

## 2010-11-19 VITALS — Ht 69.0 in | Wt 173.0 lb

## 2010-11-19 DIAGNOSIS — G459 Transient cerebral ischemic attack, unspecified: Secondary | ICD-10-CM

## 2010-11-19 DIAGNOSIS — I2510 Atherosclerotic heart disease of native coronary artery without angina pectoris: Secondary | ICD-10-CM

## 2010-11-19 DIAGNOSIS — Z01810 Encounter for preprocedural cardiovascular examination: Secondary | ICD-10-CM

## 2010-11-19 DIAGNOSIS — R0609 Other forms of dyspnea: Secondary | ICD-10-CM

## 2010-11-19 DIAGNOSIS — I739 Peripheral vascular disease, unspecified: Secondary | ICD-10-CM

## 2010-11-19 DIAGNOSIS — R079 Chest pain, unspecified: Secondary | ICD-10-CM

## 2010-11-19 MED ORDER — TECHNETIUM TC 99M TETROFOSMIN IV KIT
11.0000 | PACK | Freq: Once | INTRAVENOUS | Status: AC | PRN
  Administered 2010-11-19: 11 via INTRAVENOUS

## 2010-11-19 MED ORDER — TECHNETIUM TC 99M TETROFOSMIN IV KIT
33.0000 | PACK | Freq: Once | INTRAVENOUS | Status: AC | PRN
  Administered 2010-11-19: 33 via INTRAVENOUS

## 2010-11-19 MED ORDER — REGADENOSON 0.4 MG/5ML IV SOLN
0.4000 mg | Freq: Once | INTRAVENOUS | Status: AC
  Administered 2010-11-19: 0.4 mg via INTRAVENOUS

## 2010-11-19 NOTE — Progress Notes (Signed)
[facility] SITE 3 NUCLEAR MED [address] [place] [address] [phone]  Cardiology Nuclear Med Study  [name] is a 55 y.o. female [id number] 1955-12-26   Nuclear Med Background Indication for Stress Test:  Evaluation for Ischemia and Surgical Clearance: pending Fem-pop bypass by Dr. Arelia Longest History:  '08 QIO:NGEX inferior ischemia, '09 Cath:3-V CAD, medical tx. Cardiac Risk Factors: Claudication, Family History - CAD, Hypertension, IDDM Type 2, Lipids, PVD, Smoker and TIA  Symptoms:  Chest Pain>left arm (last date of chest discomfort was about one month ago), Diaphoresis, Dizziness, DOE?SOB, and Fatigue   Nuclear Pre-Procedure Caffeine/Decaff Intake:  None NPO After: 1:30am   Lungs:  Diminished, no wheezing.  O2 sat 99% on RA. IV 0.9% NS with Angio Cath:  20g  IV Site: L Hand  IV Started by:  Irean Hong, RN  Chest Size (in):  38 Cup Size: C  Height: 5\' 9"  (1.753 m)  Weight:  173 lb (78.472 kg)  BMI:  Body mass index is 25.55 kg/(m^2). Tech Comments:  Atenolol this am per MD    Nuclear Med Study 1 or 2 day study: 1 day  Stress Test Type:  Lexiscan  Reading MD: Cassell Clement, MD  Order Authorizing Provider:  Charlton Haws, MD  Resting Radionuclide: Technetium 89m Tetrofosmin  Resting Radionuclide Dose: 10.7 mCi   Stress Radionuclide:  Technetium 18m Tetrofosmin  Stress Radionuclide Dose: 33.0 mCi           Stress Protocol Rest HR: 72 Stress HR: 91  Rest BP: 103/63 Stress BP: 110/56  Exercise Time (min): n/a METS: n/a   Predicted Max HR: 165 bpm % Max HR: 55.15 bpm Rate Pressure Product: 52841   Dose of Adenosine (mg):  n/a Dose of Lexiscan: 0.4 mg  Dose of Atropine (mg): n/a Dose of Dobutamine: n/a mcg/kg/min (at max HR)  Stress Test Technologist: Smiley Houseman, CMA-N  Nuclear Technologist:  Domenic Polite, CNMT     Rest Procedure:  Myocardial perfusion imaging was performed at rest 45 minutes following the intravenous  administration of Technetium 65m Tetrofosmin.  Rest ECG: No acute changes.  Stress Procedure:  The patient received IV Lexiscan 0.4 mg over 15-seconds.  Technetium 37m Tetrofosmin injected at 30-seconds.  There were no significant changes with Lexiscan.  Quantitative spect images were obtained after a 45 minute delay.  Stress ECG: No significant change from baseline ECG  QPS Raw Data Images:  Normal; no motion artifact; normal heart/lung ratio. Stress Images:  Normal homogeneous uptake in all areas of the myocardium. Rest Images:  Normal homogeneous uptake in all areas of the myocardium. Subtraction (SDS):  No evidence of ischemia. Transient Ischemic Dilatation (Normal <1.22):  1.16 Lung/Heart Ratio (Normal <0.45):  .35  Quantitative Gated Spect Images QGS EDV:  93 ml QGS ESV:  37 ml QGS cine images:  NL LV Function; NL Wall Motion QGS EF: 61%  Impression Exercise Capacity:  Lexiscan with no exercise. BP Response:  Normal blood pressure response. Clinical Symptoms:  No chest pain. ECG Impression:  No significant ST segment change suggestive of ischemia. Comparison with Prior Nuclear Study: No images to compare  Overall Impression:  Normal stress nuclear study.    Cassell Clement

## 2010-11-19 NOTE — Progress Notes (Signed)
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 Moderate LAD and circumflex disease. 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 Needs fem pop bypass to avoid LBKA. Limited mobility but no angina. Describes "TIA;s with numbness on left side of face. No focal deficits. Compliant with ASA and Plavix. No recent cardiac or vascular studies since 2009. Long discussion about smoking and fact that she will fail her fempop and require amputation. She does not appear to be motivated to quit. Denies palpiations, mild chronic dypnea. No syncope, diaphoresis. She has had breast CA and was told her chest would never heal with CABG and she is terrified of this  Dr Tanda Rockers removed her gangraneous lef toe with no complications.  I told her it was ok to use hyperbaric oxygen chamber for healing.    Has had improvement in GERD with Dexilant.  BS under good control  Reviewed nuclear from today minimal inferior wall thinning no frank infarct or ischemia EF 61% Reviewed carotid and 60-79% left ICA needs F/U duplex in a year  Given these studies we will clear her for vascular surgery with Dr Myra Gianotti.  ROS: Denies fever, malais, weight loss, blurry vision, decreased visual acuity, cough, sputum, SOB, hemoptysis, pleuritic pain, palpitaitons, heartburn, abdominal pain, melena, lower extremity edema, claudication, or rash.  All other systems reviewed and negative  General: Affect appropriate Healthy:  appears stated age HEENT: normal Neck supple with no adenopathy JVP normal no bruits no thyromegaly Lungs clear with no wheezing and good diaphragmatic motion Heart:  S1/S2 no murmur,rub, gallop or click PMI normal Abdomen: benighn, BS positve, no tenderness, no AAA no bruit.  No HSM or HJR Distal pulses intact with no bruits No edema Neuro non-focal Skin warm and dry No  muscular weakness   Current Outpatient Prescriptions  Medication Sig Dispense Refill  . APIDRA SOLOSTAR 100 UNIT/ML injection DAILY SLIDING SCALE      . aspirin 81 MG tablet Take 81 mg by mouth daily.        Marland Kitchen atenolol (TENORMIN) 25 MG tablet Take 1 tablet by mouth Daily.      . B-D INS SYR ULTRAFINE 1CC/31G 31G X 5/16" 1 ML MISC       . B-D ULTRAFINE III SHORT PEN 31G X 8 MM MISC       . dexlansoprazole (DEXILANT) 60 MG capsule Take 60 mg by mouth daily.        . furosemide (LASIX) 20 MG tablet Take 20 mg by mouth as needed.       Marland Kitchen HUMULIN 70/30 70-30 % injection 55 UNITS IN THE MORNING, 65 UNITS AT NIGHT      . meperidine (DEMEROL) 50 MG tablet Take 50 mg by mouth every 6 (six) hours as needed.        Marland Kitchen NITROSTAT 0.4 MG SL tablet Take by mouth. AS NEEDED FOR CHEST PAINS EVERY 5 MINUTES UP TO 3 DOSES      . NON FORMULARY Alprozolam... 0.5mg  1 daily as needed       . DISCONTD: hyoscyamine (LEVSIN, ANASPAZ) 0.125 MG tablet Take 0.125 mg by mouth every 6 (six) hours as needed.        Marland Kitchen DISCONTD: PLAVIX 75 MG tablet Take 1 tablet by mouth Daily.       No current facility-administered medications for this visit.   Facility-Administered Medications  Ordered in Other Visits  Medication Dose Route Frequency Provider Last Rate Last Dose  . regadenoson (LEXISCAN) injection SOLN 0.4 mg  0.4 mg Intravenous Once Wendall Stade, MD   0.4 mg at 11/19/10 1610  . technetium tetrofosmin (TC-MYOVIEW) injection 11 milli Curie  11 milli Curie Intravenous Once PRN Cassell Clement, MD   11 milli Curie at 11/19/10 0820  . technetium tetrofosmin (TC-MYOVIEW) injection 33 milli Curie  33 milli Curie Intravenous Once PRN Cassell Clement, MD   33 milli Curie at 11/19/10 0940    Allergies  Lidocaine; Procaine hcl; Tricor; Altace; Atacand; Codeine; Iohexol; Lyrica; Neurontin; Other; Soap; Vitamin d analogs; Lodine; and Tetracyclines & related  Electrocardiogram:  Assessment and Plan

## 2010-11-19 NOTE — Assessment & Plan Note (Signed)
Stable with no angina and good activity level.  Continue medical Rx  Myovue normal so clear for vascular surgery

## 2010-11-19 NOTE — Assessment & Plan Note (Signed)
Off Plavix for vascular surgery.  F/U carotid duplex in a year

## 2010-11-19 NOTE — Assessment & Plan Note (Signed)
Gangrenous toe gone.  F/U Dr Tanda Rockers for hyperbaric Rx  Vascular surgery with Dr Myra Gianotti to be scheduled

## 2010-11-20 NOTE — Progress Notes (Signed)
Copy routed Dr. Eden Emms.Scarlette Ar

## 2010-11-23 ENCOUNTER — Encounter (INDEPENDENT_AMBULATORY_CARE_PROVIDER_SITE_OTHER): Payer: Medicare Other

## 2010-11-23 ENCOUNTER — Ambulatory Visit (INDEPENDENT_AMBULATORY_CARE_PROVIDER_SITE_OTHER): Payer: Medicare Other | Admitting: Surgery

## 2010-11-23 DIAGNOSIS — Z01810 Encounter for preprocedural cardiovascular examination: Secondary | ICD-10-CM

## 2010-11-23 DIAGNOSIS — I739 Peripheral vascular disease, unspecified: Secondary | ICD-10-CM

## 2010-11-23 DIAGNOSIS — L98499 Non-pressure chronic ulcer of skin of other sites with unspecified severity: Secondary | ICD-10-CM

## 2010-11-24 NOTE — Assessment & Plan Note (Signed)
OFFICE VISIT  [name] [name] DOB:  [birth year]                                       11/23/2010 CHART#:[id number]  The patient comes back in today for followup.  Since I last saw her, she has had a bone removed from her left fourth toe.  According to her and the nurses that go to her home it is getting slightly better.  Her most recent ABI was 0.42.  She has had an angiogram which shows an occluded femoral popliteal artery.  She comes back in today for discussion of a below knee fem-pop bypass graft.  She has vein map prior to her arrival.  PHYSICAL EXAMINATION:  Vital signs:  Blood pressure is 115/68.  General: She is well-appearing, in no distress.  Respirations:  Nonlabored. Cardiovascular:  She has a palpable left femoral pulse.  No palpable pedal pulse.  The left fourth toe has a small open area with mild surrounding erythema.  No drainage.  No foul odor.  Per the patient this is getting better.  DIAGNOSTIC STUDIES:  Vein mapping was performed today.  Left greater saphenous vein measures 0.41 in smallest diameter.  ASSESSMENT:  Left fourth toe wound.  PLAN:  The patient states that the wound is getting a little bit smaller.  She is entertaining hyperbaric therapy.  She has been cleared by Dr. Eden Emms for vascular surgery.  After our discussion we have elected to see if we can treat this without operation.  She is going to try hyperbaric therapy to see if this gives her some improvement.  I told her that if there is any deterioration in her wound to contact me and we will proceed with bypass otherwise I will see her back in 5 weeks.    Jorge Ny, MD Electronically Signed  VWB/MEDQ  D:  11/23/2010  T:  11/24/2010  Job:  [id number]  cc:   Jake Shark A. Tanda Rockers, M.D. Noralyn Pick. Eden Emms, MD, [facility]

## 2010-11-25 ENCOUNTER — Encounter: Payer: Self-pay | Admitting: Cardiovascular Disease

## 2010-11-27 NOTE — Progress Notes (Signed)
pt aware of results [name]  

## 2010-11-30 ENCOUNTER — Emergency Department (HOSPITAL_COMMUNITY): Payer: Medicare Other

## 2010-11-30 ENCOUNTER — Emergency Department (HOSPITAL_COMMUNITY)
Admission: EM | Admit: 2010-11-30 | Discharge: 2010-11-30 | Disposition: A | Discharge status: Home / Self Care | Payer: Medicare Other | Attending: Emergency Medicine | Admitting: Emergency Medicine

## 2010-11-30 DIAGNOSIS — L02619 Cutaneous abscess of unspecified foot: Secondary | ICD-10-CM | POA: Insufficient documentation

## 2010-11-30 DIAGNOSIS — Z8673 Personal history of transient ischemic attack (TIA), and cerebral infarction without residual deficits: Secondary | ICD-10-CM | POA: Insufficient documentation

## 2010-11-30 DIAGNOSIS — M79609 Pain in unspecified limb: Secondary | ICD-10-CM | POA: Insufficient documentation

## 2010-11-30 DIAGNOSIS — Z79899 Other long term (current) drug therapy: Secondary | ICD-10-CM | POA: Insufficient documentation

## 2010-11-30 DIAGNOSIS — E119 Type 2 diabetes mellitus without complications: Secondary | ICD-10-CM | POA: Insufficient documentation

## 2010-11-30 DIAGNOSIS — I2510 Atherosclerotic heart disease of native coronary artery without angina pectoris: Secondary | ICD-10-CM | POA: Insufficient documentation

## 2010-11-30 DIAGNOSIS — Z7982 Long term (current) use of aspirin: Secondary | ICD-10-CM | POA: Insufficient documentation

## 2010-11-30 DIAGNOSIS — L03039 Cellulitis of unspecified toe: Secondary | ICD-10-CM | POA: Insufficient documentation

## 2010-11-30 DIAGNOSIS — R609 Edema, unspecified: Secondary | ICD-10-CM | POA: Insufficient documentation

## 2010-11-30 DIAGNOSIS — S88119A Complete traumatic amputation at level between knee and ankle, unspecified lower leg, initial encounter: Secondary | ICD-10-CM | POA: Insufficient documentation

## 2010-11-30 DIAGNOSIS — Z853 Personal history of malignant neoplasm of breast: Secondary | ICD-10-CM | POA: Insufficient documentation

## 2010-11-30 DIAGNOSIS — M7989 Other specified soft tissue disorders: Secondary | ICD-10-CM | POA: Insufficient documentation

## 2010-11-30 DIAGNOSIS — R209 Unspecified disturbances of skin sensation: Secondary | ICD-10-CM | POA: Insufficient documentation

## 2010-11-30 DIAGNOSIS — Z794 Long term (current) use of insulin: Secondary | ICD-10-CM | POA: Insufficient documentation

## 2010-11-30 DIAGNOSIS — I739 Peripheral vascular disease, unspecified: Secondary | ICD-10-CM | POA: Insufficient documentation

## 2010-11-30 LAB — CBC
HCT: 41 % (ref 36.0–46.0)
Hemoglobin: 14.2 g/dL (ref 12.0–15.0)
MCH: 29.5 pg (ref 26.0–34.0)
MCV: 85.2 fL (ref 78.0–100.0)
Platelets: 239 10*3/uL (ref 150–400)
RBC: 4.81 MIL/uL (ref 3.87–5.11)

## 2010-11-30 LAB — BASIC METABOLIC PANEL
BUN: 10 mg/dL (ref 6–23)
CO2: 29 mEq/L (ref 19–32)
Calcium: 10.1 mg/dL (ref 8.4–10.5)
Creatinine, Ser: 0.47 mg/dL — ABNORMAL LOW (ref 0.50–1.10)
Glucose, Bld: 220 mg/dL — ABNORMAL HIGH (ref 70–99)

## 2010-11-30 LAB — DIFFERENTIAL
Eosinophils Absolute: 0.2 10*3/uL (ref 0.0–0.7)
Lymphs Abs: 2.9 10*3/uL (ref 0.7–4.0)
Monocytes Relative: 6 % (ref 3–12)
Neutrophils Relative %: 69 % (ref 43–77)

## 2010-12-03 ENCOUNTER — Inpatient Hospital Stay (HOSPITAL_COMMUNITY): Payer: Medicare Other

## 2010-12-03 ENCOUNTER — Inpatient Hospital Stay (HOSPITAL_COMMUNITY)
Admission: RE | Admit: 2010-12-03 | Discharge: 2010-12-08 | DRG: 253 | Disposition: A | Discharge status: Home Health | Payer: Medicare Other | Source: Ambulatory Visit | Attending: Surgery | Admitting: Surgery

## 2010-12-03 DIAGNOSIS — Z7982 Long term (current) use of aspirin: Secondary | ICD-10-CM

## 2010-12-03 DIAGNOSIS — I739 Peripheral vascular disease, unspecified: Principal | ICD-10-CM | POA: Diagnosis present

## 2010-12-03 DIAGNOSIS — E785 Hyperlipidemia, unspecified: Secondary | ICD-10-CM | POA: Diagnosis present

## 2010-12-03 DIAGNOSIS — Z853 Personal history of malignant neoplasm of breast: Secondary | ICD-10-CM

## 2010-12-03 DIAGNOSIS — S88119A Complete traumatic amputation at level between knee and ankle, unspecified lower leg, initial encounter: Secondary | ICD-10-CM

## 2010-12-03 DIAGNOSIS — I2510 Atherosclerotic heart disease of native coronary artery without angina pectoris: Secondary | ICD-10-CM | POA: Diagnosis present

## 2010-12-03 DIAGNOSIS — F17200 Nicotine dependence, unspecified, uncomplicated: Secondary | ICD-10-CM | POA: Diagnosis present

## 2010-12-03 DIAGNOSIS — I96 Gangrene, not elsewhere classified: Secondary | ICD-10-CM | POA: Diagnosis present

## 2010-12-03 DIAGNOSIS — Z9010 Acquired absence of unspecified breast and nipple: Secondary | ICD-10-CM

## 2010-12-03 DIAGNOSIS — E1159 Type 2 diabetes mellitus with other circulatory complications: Secondary | ICD-10-CM | POA: Diagnosis present

## 2010-12-03 LAB — CBC
HCT: 37.5 % (ref 36.0–46.0)
MCV: 85.6 fL (ref 78.0–100.0)
RBC: 4.38 MIL/uL (ref 3.87–5.11)
WBC: 13.6 10*3/uL — ABNORMAL HIGH (ref 4.0–10.5)

## 2010-12-03 LAB — BASIC METABOLIC PANEL
BUN: 11 mg/dL (ref 6–23)
Chloride: 99 mEq/L (ref 96–112)
GFR calc Af Amer: >60 mL/min (ref >60)
Glucose, Bld: 265 mg/dL — ABNORMAL HIGH (ref 70–99)
Potassium: 4.4 mEq/L (ref 3.5–5.1)
Sodium: 133 mEq/L — ABNORMAL LOW (ref 135–145)

## 2010-12-03 LAB — URINALYSIS, ROUTINE W REFLEX MICROSCOPIC
Bilirubin Urine: NEGATIVE
Hgb urine dipstick: NEGATIVE
Nitrite: NEGATIVE
Specific Gravity, Urine: 1.013 (ref 1.005–1.030)
pH: 6 (ref 5.0–8.0)

## 2010-12-04 ENCOUNTER — Other Ambulatory Visit: Payer: Self-pay | Admitting: Surgery

## 2010-12-04 DIAGNOSIS — I70269 Atherosclerosis of native arteries of extremities with gangrene, unspecified extremity: Secondary | ICD-10-CM

## 2010-12-04 LAB — GLUCOSE, CAPILLARY
Glucose-Capillary: 172 mg/dL — ABNORMAL HIGH (ref 70–99)
Glucose-Capillary: 127 mg/dL — ABNORMAL HIGH (ref 70–99)
Glucose-Capillary: 193 mg/dL — ABNORMAL HIGH (ref 70–99)

## 2010-12-04 LAB — PROTIME-INR: INR: 1.12 (ref 0.00–1.49)

## 2010-12-05 DIAGNOSIS — I739 Peripheral vascular disease, unspecified: Secondary | ICD-10-CM

## 2010-12-05 LAB — GLUCOSE, CAPILLARY
Glucose-Capillary: 222 mg/dL — ABNORMAL HIGH (ref 70–99)
Glucose-Capillary: 224 mg/dL — ABNORMAL HIGH (ref 70–99)
Glucose-Capillary: 251 mg/dL — ABNORMAL HIGH (ref 70–99)

## 2010-12-05 LAB — CBC
HCT: 32.7 % — ABNORMAL LOW (ref 36.0–46.0)
Hemoglobin: 10.9 g/dL — ABNORMAL LOW (ref 12.0–15.0)
MCH: 29.1 pg (ref 26.0–34.0)
MCHC: 33.3 g/dL (ref 30.0–36.0)
MCV: 87.2 fL (ref 78.0–100.0)
RBC: 3.75 MIL/uL — ABNORMAL LOW (ref 3.87–5.11)

## 2010-12-05 LAB — BASIC METABOLIC PANEL
BUN: 10 mg/dL (ref 6–23)
CO2: 26 mEq/L (ref 19–32)
Calcium: 8.5 mg/dL (ref 8.4–10.5)
Creatinine, Ser: 0.47 mg/dL — ABNORMAL LOW (ref 0.50–1.10)
GFR calc non Af Amer: >60 mL/min (ref >60)
Glucose, Bld: 164 mg/dL — ABNORMAL HIGH (ref 70–99)

## 2010-12-05 LAB — HEMOGLOBIN A1C
Hgb A1c MFr Bld: 8.7 % — ABNORMAL HIGH (ref <5.7)
Mean Plasma Glucose: 203 mg/dL — ABNORMAL HIGH (ref <117)

## 2010-12-06 LAB — CROSSMATCH: Antibody Screen: NEGATIVE

## 2010-12-06 LAB — GLUCOSE, CAPILLARY
Glucose-Capillary: 193 mg/dL — ABNORMAL HIGH (ref 70–99)
Glucose-Capillary: 186 mg/dL — ABNORMAL HIGH (ref 70–99)
Glucose-Capillary: 182 mg/dL — ABNORMAL HIGH (ref 70–99)

## 2010-12-07 LAB — GLUCOSE, CAPILLARY
Glucose-Capillary: 187 mg/dL — ABNORMAL HIGH (ref 70–99)
Glucose-Capillary: 235 mg/dL — ABNORMAL HIGH (ref 70–99)
Glucose-Capillary: 197 mg/dL — ABNORMAL HIGH (ref 70–99)

## 2010-12-08 LAB — GLUCOSE, CAPILLARY: Glucose-Capillary: 198 mg/dL — ABNORMAL HIGH (ref 70–99)

## 2010-12-08 LAB — POCT I-STAT 4, (NA,K, GLUC, HGB,HCT)
Glucose, Bld: 137 mg/dL — ABNORMAL HIGH (ref 70–99)
HCT: 32 % — ABNORMAL LOW (ref 36.0–46.0)
Hemoglobin: 10.9 g/dL — ABNORMAL LOW (ref 12.0–15.0)
Hemoglobin: 10.5 g/dL — ABNORMAL LOW (ref 12.0–15.0)
Potassium: 3.9 mEq/L (ref 3.5–5.1)
Potassium: 4 mEq/L (ref 3.5–5.1)
Sodium: 140 mEq/L (ref 135–145)
Sodium: 140 mEq/L (ref 135–145)

## 2010-12-17 ENCOUNTER — Inpatient Hospital Stay (HOSPITAL_COMMUNITY)
Admission: RE | Admit: 2010-12-17 | Discharge: 2010-12-23 | DRG: 315 | Disposition: A | Discharge status: Home Health | Payer: Medicare Other | Source: Ambulatory Visit | Attending: Surgery | Admitting: Surgery

## 2010-12-17 ENCOUNTER — Emergency Department (HOSPITAL_COMMUNITY): Payer: Medicare Other

## 2010-12-17 ENCOUNTER — Emergency Department (HOSPITAL_COMMUNITY)
Admission: EM | Admit: 2010-12-17 | Discharge: 2010-12-17 | Disposition: A | Discharge status: Other Acute Inpatient Hospital | Payer: Medicare Other | Source: Home / Self Care | Attending: Emergency Medicine | Admitting: Emergency Medicine

## 2010-12-17 DIAGNOSIS — F17200 Nicotine dependence, unspecified, uncomplicated: Secondary | ICD-10-CM | POA: Diagnosis present

## 2010-12-17 DIAGNOSIS — T827XXA Infection and inflammatory reaction due to other cardiac and vascular devices, implants and grafts, initial encounter: Principal | ICD-10-CM | POA: Diagnosis present

## 2010-12-17 DIAGNOSIS — Y835 Amputation of limb(s) as the cause of abnormal reaction of the patient, or of later complication, without mention of misadventure at the time of the procedure: Secondary | ICD-10-CM | POA: Insufficient documentation

## 2010-12-17 DIAGNOSIS — Z8673 Personal history of transient ischemic attack (TIA), and cerebral infarction without residual deficits: Secondary | ICD-10-CM

## 2010-12-17 DIAGNOSIS — Y849 Medical procedure, unspecified as the cause of abnormal reaction of the patient, or of later complication, without mention of misadventure at the time of the procedure: Secondary | ICD-10-CM | POA: Diagnosis present

## 2010-12-17 DIAGNOSIS — L02419 Cutaneous abscess of limb, unspecified: Secondary | ICD-10-CM | POA: Diagnosis present

## 2010-12-17 DIAGNOSIS — A4902 Methicillin resistant Staphylococcus aureus infection, unspecified site: Secondary | ICD-10-CM | POA: Diagnosis present

## 2010-12-17 DIAGNOSIS — R509 Fever, unspecified: Secondary | ICD-10-CM | POA: Insufficient documentation

## 2010-12-17 DIAGNOSIS — E119 Type 2 diabetes mellitus without complications: Secondary | ICD-10-CM | POA: Insufficient documentation

## 2010-12-17 DIAGNOSIS — T8140XA Infection following a procedure, unspecified, initial encounter: Secondary | ICD-10-CM | POA: Insufficient documentation

## 2010-12-17 DIAGNOSIS — R791 Abnormal coagulation profile: Secondary | ICD-10-CM | POA: Diagnosis present

## 2010-12-17 DIAGNOSIS — Z794 Long term (current) use of insulin: Secondary | ICD-10-CM | POA: Insufficient documentation

## 2010-12-17 DIAGNOSIS — S88119A Complete traumatic amputation at level between knee and ankle, unspecified lower leg, initial encounter: Secondary | ICD-10-CM

## 2010-12-17 DIAGNOSIS — Z853 Personal history of malignant neoplasm of breast: Secondary | ICD-10-CM

## 2010-12-17 DIAGNOSIS — D649 Anemia, unspecified: Secondary | ICD-10-CM | POA: Diagnosis present

## 2010-12-17 DIAGNOSIS — I2510 Atherosclerotic heart disease of native coronary artery without angina pectoris: Secondary | ICD-10-CM | POA: Diagnosis present

## 2010-12-17 DIAGNOSIS — I739 Peripheral vascular disease, unspecified: Secondary | ICD-10-CM | POA: Diagnosis present

## 2010-12-17 LAB — DIFFERENTIAL
Basophils Relative: 0 % (ref 0–1)
Eosinophils Relative: 1 % (ref 0–5)
Lymphs Abs: 1.9 10*3/uL (ref 0.7–4.0)
Monocytes Absolute: 1 10*3/uL (ref 0.1–1.0)

## 2010-12-17 LAB — CBC
HCT: 38.3 % (ref 36.0–46.0)
MCHC: 32.6 g/dL (ref 30.0–36.0)
MCV: 87.2 fL (ref 78.0–100.0)
RDW: 13.5 % (ref 11.5–15.5)

## 2010-12-17 LAB — BASIC METABOLIC PANEL
BUN: 17 mg/dL (ref 6–23)
Creatinine, Ser: 0.57 mg/dL (ref 0.50–1.10)
GFR calc Af Amer: >60 mL/min (ref >60)
GFR calc non Af Amer: >60 mL/min (ref >60)

## 2010-12-17 LAB — GLUCOSE, CAPILLARY: Glucose-Capillary: 117 mg/dL — ABNORMAL HIGH (ref 70–99)

## 2010-12-17 NOTE — Procedures (Unsigned)
VASCULAR LAB EXAM  INDICATION:  Left lower extremity vein mapping.  HISTORY: Diabetes: Cardiac: Hypertension:  EXAM:  Left lower extremity vein mapping.  IMPRESSION: 1. The left greater saphenous vein is compressible with diameter     measurements ranging from 0.31-0.48 cm. 2. The left short saphenous vein is compressible with diameter     measurements ranging from 0.18-0.21 cm.  ___________________________________________ V. Charlena Cross, MD  CH/MEDQ  D:  11/23/2010  T:  11/23/2010  Job:  [id number]

## 2010-12-18 DIAGNOSIS — M79609 Pain in unspecified limb: Secondary | ICD-10-CM

## 2010-12-18 DIAGNOSIS — Y849 Medical procedure, unspecified as the cause of abnormal reaction of the patient, or of later complication, without mention of misadventure at the time of the procedure: Secondary | ICD-10-CM

## 2010-12-18 DIAGNOSIS — T827XXA Infection and inflammatory reaction due to other cardiac and vascular devices, implants and grafts, initial encounter: Secondary | ICD-10-CM

## 2010-12-18 LAB — GLUCOSE, CAPILLARY
Glucose-Capillary: 184 mg/dL — ABNORMAL HIGH (ref 70–99)
Glucose-Capillary: 268 mg/dL — ABNORMAL HIGH (ref 70–99)
Glucose-Capillary: 174 mg/dL — ABNORMAL HIGH (ref 70–99)

## 2010-12-18 LAB — BASIC METABOLIC PANEL
CO2: 23 mEq/L (ref 19–32)
Calcium: 8.5 mg/dL (ref 8.4–10.5)
Creatinine, Ser: 0.93 mg/dL (ref 0.50–1.10)
GFR calc Af Amer: >60 mL/min (ref >60)
GFR calc non Af Amer: >60 mL/min (ref >60)
Sodium: 131 mEq/L — ABNORMAL LOW (ref 135–145)

## 2010-12-18 LAB — CBC
MCV: 86.5 fL (ref 78.0–100.0)
Platelets: 301 10*3/uL (ref 150–400)
RBC: 3.7 MIL/uL — ABNORMAL LOW (ref 3.87–5.11)
RDW: 13.9 % (ref 11.5–15.5)
WBC: 29.6 10*3/uL — ABNORMAL HIGH (ref 4.0–10.5)

## 2010-12-18 LAB — URINALYSIS, MICROSCOPIC ONLY
Ketones, ur: NEGATIVE mg/dL
Protein, ur: NEGATIVE mg/dL
Urobilinogen, UA: 0.2 mg/dL (ref 0.0–1.0)

## 2010-12-19 ENCOUNTER — Inpatient Hospital Stay (HOSPITAL_COMMUNITY): Payer: Medicare Other

## 2010-12-19 ENCOUNTER — Encounter (HOSPITAL_COMMUNITY): Payer: Self-pay | Admitting: Radiology

## 2010-12-19 LAB — BASIC METABOLIC PANEL
BUN: 14 mg/dL (ref 6–23)
Chloride: 98 mEq/L (ref 96–112)
Creatinine, Ser: 0.7 mg/dL (ref 0.50–1.10)
GFR calc Af Amer: >60 mL/min (ref >60)
GFR calc non Af Amer: >60 mL/min (ref >60)
Potassium: 3.9 mEq/L (ref 3.5–5.1)

## 2010-12-19 LAB — URINE CULTURE: Culture  Setup Time: 201207061329

## 2010-12-19 LAB — MRSA PCR SCREENING: MRSA by PCR: NEGATIVE

## 2010-12-19 LAB — POTASSIUM: Potassium: 3.9 mEq/L (ref 3.5–5.1)

## 2010-12-19 LAB — CBC
HCT: 31.1 % — ABNORMAL LOW (ref 36.0–46.0)
MCHC: 33.4 g/dL (ref 30.0–36.0)
MCV: 86.6 fL (ref 78.0–100.0)
Platelets: 281 10*3/uL (ref 150–400)
RDW: 13.7 % (ref 11.5–15.5)
WBC: 20.9 10*3/uL — ABNORMAL HIGH (ref 4.0–10.5)

## 2010-12-19 LAB — GLUCOSE, CAPILLARY

## 2010-12-20 DIAGNOSIS — L02419 Cutaneous abscess of limb, unspecified: Secondary | ICD-10-CM

## 2010-12-20 DIAGNOSIS — L03119 Cellulitis of unspecified part of limb: Secondary | ICD-10-CM

## 2010-12-20 LAB — GLUCOSE, CAPILLARY
Glucose-Capillary: 149 mg/dL — ABNORMAL HIGH (ref 70–99)
Glucose-Capillary: 214 mg/dL — ABNORMAL HIGH (ref 70–99)
Glucose-Capillary: 204 mg/dL — ABNORMAL HIGH (ref 70–99)

## 2010-12-20 LAB — CBC
Hemoglobin: 8.7 g/dL — ABNORMAL LOW (ref 12.0–15.0)
MCH: 28.6 pg (ref 26.0–34.0)
MCHC: 33.1 g/dL (ref 30.0–36.0)

## 2010-12-21 ENCOUNTER — Ambulatory Visit: Payer: Medicare Other | Admitting: Surgery

## 2010-12-21 LAB — VANCOMYCIN, TROUGH: Vancomycin Tr: 5.7 ug/mL — ABNORMAL LOW (ref 10.0–20.0)

## 2010-12-21 LAB — GLUCOSE, CAPILLARY
Glucose-Capillary: 171 mg/dL — ABNORMAL HIGH (ref 70–99)
Glucose-Capillary: 132 mg/dL — ABNORMAL HIGH (ref 70–99)

## 2010-12-22 LAB — GLUCOSE, CAPILLARY
Glucose-Capillary: 109 mg/dL — ABNORMAL HIGH (ref 70–99)
Glucose-Capillary: 166 mg/dL — ABNORMAL HIGH (ref 70–99)
Glucose-Capillary: 143 mg/dL — ABNORMAL HIGH (ref 70–99)

## 2010-12-22 NOTE — Op Note (Signed)
  [name], [name]                 ACCOUNT NO.:  [id number]  MEDICAL RECORD NO.:  [id number]  LOCATION:  2038                         FACILITY:  [facility]  PHYSICIAN:  Di Kindle. Edilia Bo, M.D.DATE OF BIRTH:  1955-11-03  DATE OF PROCEDURE:  12/20/2010 DATE OF DISCHARGE:                              OPERATIVE REPORT   PREOPERATIVE DIAGNOSIS:  Abscess of the left calf.  POSTOPERATIVE DIAGNOSIS:  Abscess of the left calf.  PROCEDURE:  Incision and drainage of left calf wound.  SURGEON:  Di Kindle. Edilia Bo, MD  ANESTHESIA:  General.  INDICATIONS:  This is a 55 year old woman who had undergone a left fem- pop bypass graft with a vein graft.  She presented with a swollen calf and elevated white blood cell count and fever.  CT scan showed a collection behind and adjacent to the distal anastomosis and the incision and drainage was recommended when she continued to spike fevers.  TECHNIQUE:  The patient was taken to the operating and received a general anesthetic.  Left lower extremity was prepped and draped in the usual sterile fashion.  Previous incision below-the-knee was opened and a large amount of purulent material was expressed.  Aerobic and anaerobic cultures were sent.  The wound was then irrigated with copious amounts of saline and hemostasis was obtained.  The graft was palpable within the wound but I could not see the graft itself.  This was a vein graft.  Once the wound was thoroughly irrigated, I felt that it would be best to pack the wound with the wound completely opened.  This was packed with a wet moistened Kerlix and then sterile dressing was applied.  The patient tolerated the procedure well and was transferred to the recovery room in stable condition.  All needle and sponge counts were correct.     Di Kindle. Edilia Bo, M.D.     CSD/MEDQ  D:  12/20/2010  T:  12/21/2010  Job:  [id number]  Electronically Signed by Waverly Ferrari M.D. on 12/22/2010  02:14:35 PM

## 2010-12-23 ENCOUNTER — Encounter: Payer: Self-pay | Admitting: Surgery

## 2010-12-23 LAB — BASIC METABOLIC PANEL WITH GFR
BUN: 7 mg/dL (ref 6–23)
CO2: 26 meq/L (ref 19–32)
Calcium: 8.8 mg/dL (ref 8.4–10.5)
Chloride: 101 meq/L (ref 96–112)
Creatinine, Ser: 0.48 mg/dL — ABNORMAL LOW (ref 0.50–1.10)
GFR calc Af Amer: >60 mL/min
GFR calc non Af Amer: >60 mL/min
Glucose, Bld: 198 mg/dL — ABNORMAL HIGH (ref 70–99)
Potassium: 3.7 meq/L (ref 3.5–5.1)
Sodium: 136 meq/L (ref 135–145)

## 2010-12-23 LAB — WOUND CULTURE

## 2010-12-23 LAB — CBC
MCH: 28.5 pg (ref 26.0–34.0)
Platelets: 282 10*3/uL (ref 150–400)
RBC: 3.4 MIL/uL — ABNORMAL LOW (ref 3.87–5.11)
RDW: 13.7 % (ref 11.5–15.5)
WBC: 6.4 10*3/uL (ref 4.0–10.5)

## 2010-12-23 LAB — GLUCOSE, CAPILLARY
Glucose-Capillary: 195 mg/dL — ABNORMAL HIGH (ref 70–99)
Glucose-Capillary: 179 mg/dL — ABNORMAL HIGH (ref 70–99)

## 2010-12-24 LAB — CULTURE, BLOOD (ROUTINE X 2)
Culture  Setup Time: 201207060043
Culture  Setup Time: 201207061353
Culture: NO GROWTH
Culture: NO GROWTH

## 2010-12-25 ENCOUNTER — Ambulatory Visit (INDEPENDENT_AMBULATORY_CARE_PROVIDER_SITE_OTHER): Payer: Medicare Other | Admitting: Thoracic Diseases

## 2010-12-25 VITALS — HR 67 | Resp 12

## 2010-12-25 DIAGNOSIS — IMO0001 Reserved for inherently not codable concepts without codable children: Secondary | ICD-10-CM | POA: Insufficient documentation

## 2010-12-25 DIAGNOSIS — I70269 Atherosclerosis of native arteries of extremities with gangrene, unspecified extremity: Secondary | ICD-10-CM

## 2010-12-25 LAB — ANAEROBIC CULTURE

## 2010-12-25 NOTE — Progress Notes (Signed)
[facility] [place]  Postoperative Visit Bypass Surgery  History of Present Illness  [name] is a 55 y.o. female who presents for postoperative follow-up for: left fem pop Vascular Bypass and wound check . Pt had I&D of left calf wound and was D/C'd from hospital 2 days ago. The patient notes good resolution of lower extremity symptoms.  The patient is able to complete their activities of daily living.  The patient's current symptoms are: moderate pain with dressing changes. She is here today for wound check and prescription for more pain meds which she may run out of over the weekend. She states she is now down to 1 percocet every 4h but uses 2 prior to dressing changes. She brought in her prescription and it does not appear that she is overusing the medication. Her next appt is not until 7/23 and she will be out of the Percocet before that time. Pt is on Vancomycin IV through PICC and Levaquin PO.  Physical Examination  Filed Vitals:   12/25/10 1621  Pulse: 67  Resp: 12    Pt left leg is warm and well perfused with brisk capillary refill The patient's groin wounds are clean, dry, intact. The left open calf wound is clean With good granulation tissue at base of wound and serosanguinous drainage only. Right 4th toe amputation site is healing well.    Medical Decision Making  [name] is a 55 y.o. year old female who presents s/p left lower extremity bypass surgery .  The patient's bypass incisions are clean, dry, intact with good resolution of pre-operative symptoms.  The open calf wound is granulating in well- she will continue with dressing changes and IV antibiotics The pt will return for F/U with Dr Myra Gianotti on 7/23. She was given a new prescription for #30 percocet if she needs it. She will not get this filled prior to Monday. She is already cutting back on its use.

## 2010-12-27 NOTE — Op Note (Signed)
NAMESHANNA, [name]                 ACCOUNT NO.:  [id number]  MEDICAL RECORD NO.:  [id number]  LOCATION:  2012                         FACILITY:  [facility]  PHYSICIAN:  [name], MDDATE OF BIRTH:  09/27/1955  DATE OF PROCEDURE:  12/04/2010 DATE OF DISCHARGE:  12/08/2010                              OPERATIVE REPORT   PREOPERATIVE DIAGNOSIS:  Ischemic left fourth toe.  POSTOPERATIVE DIAGNOSIS:  Ischemic left fourth toe.  PROCEDURES PERFORMED: 1. Redo left common femoral artery exposure. 2. Left common femoral-to-below-knee popliteal artery bypass graft,     was transposed, nonreversed greater saphenous vein. 3. Left fourth toe amputation including metatarsal head.  FINDINGS:  A 4-mm vein.  ESTIMATED BLOOD LOSS:  See anesthesia record.  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  She has previously had left common femoral artery patch angioplasty for wound that healed.  She now comes in for bypass for limb salvage.  PROCEDURE:  The patient was identified in the holding area, taken to the operating room, and placed supine on the table.  General endotracheal anesthesia was administered.  The patient was prepped and draped in usual fashion.  A time-out was called.  Antibiotics were given.  The patient's previous left groin incision was opened with a #10 blade. Cautery was used to divide the subcutaneous tissue.  A combination of sharp and cautery dissection was used to dissect out the common femoral artery from the inguinal ligament down to the bifurcation of the superficial femoral and profunda femoral artery.  Once adequate exposure was achieved, a skin incision below the knee was performed.  This was taken down with cautery.  Gastrocutaneous muscle was selected posteriorly and the popliteal  artery and vein were identified and the artery was circumferentially dissected free.  The anterior tibial vein was ligated between silk ties.  Next, through the skin incision, the greater saphenous vein was harvested, ligating branches between silk ties and metal clips.  The vein was very reactive and was quick to spasm but was of adequate caliber.  Once the vein had been mobilized, it was ligated distally with right angle and a silk tie.  The saphenofemoral junction was then oversewn with 5-0 Prolene in 2 layers.  Next, a tunnel was used to tunnel between the heads of the gastrocnemius muscle and a subsartorial plane up into the groin.  The vein prepared on the back table.  It was distended with heparin saline and distended nicely to approximately 4 to 5 mm.  It was marked with ink pen for proper orientation.  At this point, the patient was fully heparinized and the heparin had circulated.  The artery was occluded with vascular clamps. An #11 blade was used to make an arteriotomy in the patient's previous patch angioplasty repair.  Tenotomy scissors were used to cut an ellipse and the vein was placed in a reversed fashion.  End-to-side anastomosis was created with running 5-0 Prolene.  Appropriate flush maneuvers were performed and clamp was removed  after the anastomosis was secured.  I then used a valvulotome to lyse the valves.  Once all the valves were lysed after multiple passes, there was excellent pulsatile flow through the vein.  Vein was brought through the previously created tunnel and occluded with a serrefine clamp.  Webril was placed in the upper thigh. The leg was exsanguinated with an Esmarch and a tourniquet was placed and taken to 300 mm of pressure.  I then made an arteriotomy in the below-knee popliteal artery and opened it longitudinally with Potts scissors.  Vein was cut to the appropriate length and end-to-side anastomosis was created with a running 6-0 Prolene.   Prior to completion of the anastomosis, tourniquet was let down and appropriate flush maneuvers were performed and the anastomosis was completed.  The posterior tibial artery and anterior tibial artery were then evaluated with Doppler and ____________ proceeded with the graft open.  I was satisfied with these results and did not shoot an arteriogram.  The patient's heparin was reversed with protamine as she was heparinized after the tunnel was created.  Once I was satisfied with the hemostasis, a 15 Blake drain was placed in the vein harvest tunnel and brought out through a separate stab incision and sewn into place with a 2-0 silk suture.  Vein harvest incisions were closed in 2 layers of 3-0 Vicryl. The skin was closed with 4-0 Vicryl.  The groin incision was closed with multiple layers of 2-0 and 3-0 Vicryl and the skin was closed with 4-0 Vicryl.  Below-knee incision, the fascia was closed with 2-0 Vicryl, subcutaneous tissue closed with 3-0 Vicryl, and skin closed with 4-0 Vicryl.  Dermabond was placed.  Next, attention was turned towards the necrotic fourth toe.  A fishmouth incision was made at the base of fourth toe.  A #10 blade and ___________ were used to transect the bone.  The bone was somewhat soft in this area; and therefore, rongeurs were used to debride back the bone until it became healthy, this included the metatarsal head.  At this point, there was no gross purulence.  There was adequate bleeding, so I elected to close the incision with interrupted mattress nylon suture. Sterile dressings were applied.     Jorge Ny, MD     VWB/MEDQ  D:  12/09/2010  T:  12/10/2010  Job:  [id number]  Electronically Signed by Arelia Longest IV MD on 12/27/2010 10:58:53 PM

## 2010-12-27 NOTE — Discharge Summary (Signed)
[name], [name]                 ACCOUNT NO.:  [id number]  MEDICAL RECORD NO.:  [id number]  LOCATION:  2012                         FACILITY:  [facility]  PHYSICIAN:  Juleen China IV, MDDATE OF BIRTH:  February 16, 1956  DATE OF ADMISSION:  12/03/2010 DATE OF DISCHARGE:  12/08/2010                              DISCHARGE SUMMARY   ADMITTING DIAGNOSIS:  Gangrene of the fourth left toe and femoral- popliteal occlusive disease.  PAST MEDICAL HISTORY AND DISCHARGE DIAGNOSES: 1. 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 2. Right breast cancer status post chemo and mastectomy. 3. Coronary artery disease. 4. Tobacco abuse. 5. Diabetes mellitus. 6. Stenting of the left superficial femoral artery and external iliac     and common femoral arteries. 7. Right below-knee-amputation. 8. Right femoral to popliteal bypass graft. 9. Tubal ligation. 10.Cholecystectomy. 11.Hernia repair. 12.Hyperlipidemia. 13.Endovascular repair of abdominal aortic aneurysm.  BRIEF HISTORY:  The patient is a 55 year old female who has been followed by Dr. Myra Gianotti for quite some time regarding lower extremity peripheral arterial disease, as well as a recently developed wound on the fourth toe of the left foot.  The patient underwent arteriogram by Dr. Myra Gianotti on September 08, 2010 and this revealed a proximal left common iliac artery stenosis which ws stented.  There was a dissection within the proximal portion of the left common iliac stent which was treated with an additional stent.  The left common femoral artery was patent and the SFA was occluded.  The profunda was patent with reconstitution of the popliteal artery below the knee with three-vessel runoff on the left.  The patient's wound on her left fourth toe had been treated as  an outpatient in [facility] with multiple modalities and failed to heal.  She in fact developed gangrene.  Secondary to a nonhealing wound and known femoral popliteal occlusive disease, Dr. Myra Gianotti felt the patient should undergo bypass.  HOSPITAL COURSE:  The patient was admitted and taken to the OR on December 09, 2010, for left femoral to below-the-knee popliteal artery bypass with ipsilateral nonreversed greater saphenous vein and fourth toe amputation on the left.  The patient tolerated the procedure well and was hemodynamically stable immediately postoperatively.  She was transferred from OR to the Post Anesthesia Care Unit in stable condition.  The patient was extubated without complication and woke up from anesthesia neurologically intact.  The patient's postoperative course has progressed as expected.  She had poorly controlled diabetes prior to admission and therefore a diabetes coordinator was consulted.  A Nutrition consult was obtained as well and both of these services have spoke with the patient regarding diet and lifestyle changes, as well as instruction on utilization of her insulin.  The JP and Foley were discontinued in a routine manner without difficulty.  The patient has remained afebrile with stable vital signs throughout hospital course. Physical Therapy was consulted and they have worked with the patient. She is doing quite  well with physical therapy with Darco shoe, right lower extremity prosthesis, and a walker.  Home Health Services have been established with the patient for skilled nursing PT and OT.  On December 08, 2010, the patient is without complaints.  She is afebrile with stable vital signs.  PHYSICAL EXAMINATION:  CARDIAC:  Regular rate and rhythm. LUNGS:  Clear to auscultation. WOUND:  The incisions are clean, dry and intact. EXTREMITIES:  Toe amputation site is clean, dry, and intact, and the bilateral lower extremities are warm and  well-perfused.  There is a strong Doppler signal in the left PT and AT.  The patient is doing well and was felt stable for discharge home with home health therapies.  LABORATORY DATA:  CBC on December 05, 2010, white count 12.4, hemoglobin 7.9, hematocrit 32.7, platelets 214.  BMP on December 05, 2010, sodium 137, potassium 4.1, BUN 10, creatinine 0.47.  DISCHARGE INSTRUCTIONS:  The patient received specific written discharge instructions regarding diet, activity and wound care.  She will follow with Dr. Myra Gianotti in 2 weeks.  She will be contacted by the office with date and time of that appointment.  DISCHARGE MEDICATIONS: 1. Levaquin 500 mg one p.o. daily x14 days. 2. MiraLax 17 g p.o. daily p.r.n. 3. Alprazolam 0.5 mg one p.o. daily p.r.n. 4. Apidra 7-14 units subcu t.i.d. p.r.n. sliding scale. 5. Aspirin 81 mg daily. 6. Atenolol 25 mg daily. 7. Dexilant 60 mg daily. 8. __________ OTC one p.o. daily p.r.n. 9. Hyoscyamine 0.125 mg one to two p.o. q. 6 p.r.n. 10.Meperidine 50 mg one to two q. 4-6 h. p.r.n. pain. 11.Nitroglycerin sublingual 0.4 mg q. 5 minutes p.r.n. up to three     doses. 12.Tylenol 500 mg one p.o. q. 6 p.r.n. 13.Vitamin A daily. 14.Alaway eye drop OTC 1 drop each eye daily p.r.n. 15.The patient is instructed to stop Humulin 70/30 55-65 units subcu     b.i.d.    Pecola Leisure, PA   ______________________________ V. Charlena Cross, MD   AY/MEDQ  D:  12/08/2010  T:  12/09/2010  Job:  [id number]  Electronically Signed by Pecola Leisure PA on 12/10/2010 09:44:39 AM Electronically Signed by Arelia Longest IV MD on 12/27/2010 10:58:47 PM

## 2011-01-04 ENCOUNTER — Ambulatory Visit: Payer: Medicare Other | Admitting: Surgery

## 2011-01-04 ENCOUNTER — Ambulatory Visit (INDEPENDENT_AMBULATORY_CARE_PROVIDER_SITE_OTHER): Payer: Medicare Other | Admitting: Surgery

## 2011-01-04 DIAGNOSIS — I70219 Atherosclerosis of native arteries of extremities with intermittent claudication, unspecified extremity: Secondary | ICD-10-CM

## 2011-01-05 NOTE — Assessment & Plan Note (Signed)
OFFICE VISIT  [name], [name] DOB:  Jan 12, 1956                                       01/04/2011 CHART#:[id number]  The patient underwent left common femoral to below knee popliteal bypass graft with transposed nonreversed vein and left fourth toe amputation on 12/08/2010.  She was readmitted with swelling and pain in her calf. There was a fluid collection there.  Dr. Edilia Bo took her to the operating room and evacuated it.  The vein graft was not exposed.  She has been on IV antibiotics for positive cultures.  She was seen by ID. She is on vancomycin through the 26th.  She is doing wet-to-dry dressing changes of her wound.  On examination today I examined the wound.  It looks very healthy.  I did debride some fibrinopurulent tissue in the superior aspect of the wound.  There was no foul odor.  There was no evidence of infection. The wound is healing nicely.  I would recommend continued dressing changes.  I would remove her PICC line when the vanc runs out on the 26th.  She will see me back in 6 weeks.    Jorge Ny, MD Electronically Signed  VWB/MEDQ  D:  01/04/2011  T:  01/05/2011  Job:  [id number]

## 2011-01-18 ENCOUNTER — Ambulatory Visit: Payer: Medicare Other

## 2011-01-18 ENCOUNTER — Encounter: Payer: Self-pay | Admitting: Internal Medicine

## 2011-02-01 ENCOUNTER — Encounter: Payer: Self-pay | Admitting: Surgery

## 2011-02-10 NOTE — Discharge Summary (Signed)
NAMEMAYANA, IRIGOYEN                 ACCOUNT NO.:  [id number]  MEDICAL RECORD NO.:  [id number]  LOCATION:                                 FACILITY:  PHYSICIAN:  Juleen China IV, MDDATE OF BIRTH:  1956/01/26  DATE OF ADMISSION:  12/17/2010 DATE OF DISCHARGE:  12/23/2010                              DISCHARGE SUMMARY   ADMITTING DIAGNOSIS:  Left lower extremity calf wound with fevers.  PAST MEDICAL HISTORY/DISCHARGE DIAGNOSES: 1. Methicillin-resistant Staphylococcus aureus abscess of left calf     wound from previous femoropopliteal bypass, status post irrigation     and debridement and IV antibiotic therapy. 2. Peripheral vascular disease, status post right below-knee     amputation and multiple other interventions. 3. Coronary artery disease. 4. Diabetes mellitus. 5. Nephropathy. 6. Obesity. 7. Dyslipidemia. 8. Anemia. 9. Tobacco abuse. 10.Breast cancer, status post right mastectomy and chemotherapy. 11.Cholecystectomy. 12.Tubal ligation.  BRIEF HISTORY:  The patient is a 55 year old female who presented to the office for evaluation by Dr. Imogene Burn secondary to elevated temperatures up to 102 with increased erythema in the left lower extremity.  The patient was status post redo left below-knee popliteal bypass with vein on December 09, 2010, as well as a left fourth toe amputation.  The patient had been on Levaquin at home, however, she began to note increased swelling in the left calf with acute increased pain as well as fevers.  The patient was subsequently admitted for IV antibiotic therapy and DVT scan.  HOSPITAL COURSE:  The patient was admitted as previously stated for acute pain and fevers.  She was started on Bactrim and ID consult was obtained.  Infectious Disease evaluated the patient on December 18, 2010 and changed the patient's antibiotic to IV vancomycin and p.o. Cipro.  Scan was negative DVT.  The patient continued to have persistent fevers despite a decrease in  her white count and improvement on physical exam.  Secondary to persistent fevers, Dr. Edilia Bo took the patient to the OR on December 20, 2010 for I and D of the left calf wound.  This revealed a purulent collection and cultures were sent.  This was packed open.  The patient tolerated the procedure well.  The remainder of the patient's postoperative course progressed as expected. She continually improved and remained afebrile.  Her physical exam remained stable and the left calf wound remained clean with no evidence of further infection.  Her culture did grow out MRSA.  The patient was continued on IV vancomycin and daily dressing changes with wet-to-dry dressings.  She continued to progress very well and was discharged home in stable condition on December 23, 2010 with continuation of IV antibiotic therapy.  Home health services were also arranged for continuation of wet-to-dry dressing changes.  LABORATORY DATA:  CBC and BMP on December 23, 2010, white count 6.4, hemoglobin 9.7, hematocrit 29.1, platelets 382, sodium 136, potassium 3.7, BUN 7, and creatinine 0.48.  DISCHARGE INSTRUCTIONS:  The patient received specific written discharge instructions regarding diet, activity, and wound care.  She is to continue to pack the wound with wet-to-dry dressings daily.  She was also continued on IV antibiotic therapy with vancomycin  per Infectious Disease instructions.  DISCHARGE MEDICATIONS: 1. Oxycodone 5/325 mg 1-2 p.o. q.4-6 h. p.r.n. pain. 2. Vancomycin 1250 mg IV every 8 hours. 3. Alprazolam 0.5 mg 1 p.o. daily p.r.n. 4. Apidra 7-8 units subcu t.i.d. p.r.n. 5. Aspirin 81 mg p.o. daily. 6. Atenolol 25 mg daily. 7. Dexilant 60 mg p.o. daily. 8. Ex-lax OTC p.o. daily 9. Humulin 70/30, 55-65 units subcu b.i.d. 10.Meperidine 50 mg 1-2 q.4-6 h. p.r.n. pain. 11.Nitroglycerin sublingual 0.4 mg q.5 p.r.n. up to 3 doses. 12.Tylenol Extra Strength 500 mg 1-2 q.6 p.r.n. 13.Vitamin A 1 daily.  The  patient was instructed to stop taking Levaquin 500 mg daily.     Pecola Leisure, PA   ______________________________ V. Charlena Cross, MD    AY/MEDQ  D:  01/05/2011  T:  01/05/2011  Job:  [id number]  Electronically Signed by Pecola Leisure PA on 01/13/2011 09:02:21 AM Electronically Signed by Arelia Longest IV MD on 02/10/2011 11:50:15 PM

## 2011-02-17 ENCOUNTER — Ambulatory Visit (HOSPITAL_BASED_OUTPATIENT_CLINIC_OR_DEPARTMENT_OTHER): Admission: RE | Admit: 2011-02-17 | Payer: Medicare Other | Source: Ambulatory Visit | Admitting: Orthopedic Surgery

## 2011-02-26 ENCOUNTER — Encounter: Payer: Self-pay | Admitting: Surgery

## 2011-03-01 ENCOUNTER — Ambulatory Visit (INDEPENDENT_AMBULATORY_CARE_PROVIDER_SITE_OTHER): Payer: Medicare Other | Admitting: Surgery

## 2011-03-01 ENCOUNTER — Ambulatory Visit: Payer: Medicare Other | Admitting: Surgery

## 2011-03-01 ENCOUNTER — Encounter (INDEPENDENT_AMBULATORY_CARE_PROVIDER_SITE_OTHER): Payer: Medicare Other | Admitting: *Deleted

## 2011-03-01 ENCOUNTER — Encounter: Payer: Self-pay | Admitting: Surgery

## 2011-03-01 VITALS — BP 120/77 | HR 76 | Temp 98.3°F | Wt 175.0 lb

## 2011-03-01 DIAGNOSIS — I739 Peripheral vascular disease, unspecified: Secondary | ICD-10-CM

## 2011-03-01 DIAGNOSIS — I70219 Atherosclerosis of native arteries of extremities with intermittent claudication, unspecified extremity: Secondary | ICD-10-CM

## 2011-03-01 DIAGNOSIS — Z48812 Encounter for surgical aftercare following surgery on the circulatory system: Secondary | ICD-10-CM

## 2011-03-01 NOTE — Progress Notes (Signed)
Addended by: Sharee Pimple on: 03/01/2011 04:30 PM   Modules accepted: Orders

## 2011-03-01 NOTE — Progress Notes (Signed)
The patient comes back today for followup. She is status post left common femoral to below knee popliteal artery bypass graft with transposed non-reversed greater saphenous vein and left fourth toe of dictation 12/08/2010 she was readmitted with swelling and pain in her calf fluid collection was identified Dr. Durwin Nora 2 her to. The graft was not exposed she had wound care with dressing changes and antibiotics for some period of time and is back today for followup she is doing very well. All of her wounds have healed.  Ultrasound was performed today it shows an increase in her ankle-brachial index from 0.4 0.85 the inflow was difficult to visualize she has been stented in her left common and external iliac arteries in the past velocity peak was to 40 cm/s.  Overall patient doing very well however, Berkley me in 34 months old and ultrasound in 3 months. the operating room and evacuated

## 2011-03-05 LAB — BASIC METABOLIC PANEL
BUN: 7
CO2: 27
Calcium: 9.6
Chloride: 105
Creatinine, Ser: 0.62
GFR calc Af Amer: >60
GFR calc Af Amer: >60
GFR calc non Af Amer: >60
Potassium: 4
Sodium: 136

## 2011-03-05 LAB — TYPE AND SCREEN
ABO/RH(D): O NEG
Antibody Screen: NEGATIVE

## 2011-03-05 LAB — RENAL FUNCTION PANEL
BUN: 7
CO2: 27
Glucose, Bld: 180 — ABNORMAL HIGH
Phosphorus: 2.4
Potassium: 3.5

## 2011-03-05 LAB — COMPREHENSIVE METABOLIC PANEL
Alkaline Phosphatase: 85
BUN: 10
Calcium: 9.8
Creatinine, Ser: 0.63
Glucose, Bld: 191 — ABNORMAL HIGH
Potassium: 4.3
Total Protein: 6.6

## 2011-03-05 LAB — CBC
HCT: 28.1 — ABNORMAL LOW
HCT: 31.4 — ABNORMAL LOW
HCT: 40.6
Hemoglobin: 10.8 — ABNORMAL LOW
Hemoglobin: 14
Hemoglobin: 9.7 — ABNORMAL LOW
MCHC: 34.5
MCHC: 34.6
MCHC: 34.9
MCV: 86.9
MCV: 87
Platelets: 158
Platelets: 204
RBC: 4.6
RDW: 13
RDW: 13
RDW: 13.6
RDW: 13.6
WBC: 13.4 — ABNORMAL HIGH

## 2011-03-05 LAB — POCT I-STAT 4, (NA,K, GLUC, HGB,HCT)
HCT: 33 — ABNORMAL LOW
Hemoglobin: 11.2 — ABNORMAL LOW
Operator id: 177011

## 2011-03-05 LAB — URINALYSIS, ROUTINE W REFLEX MICROSCOPIC
Hgb urine dipstick: NEGATIVE
Protein, ur: NEGATIVE
Urobilinogen, UA: 0.2

## 2011-03-05 LAB — POCT I-STAT 7, (LYTES, BLD GAS, ICA,H+H)
Calcium, Ion: 1.17
Hemoglobin: 11.2 — ABNORMAL LOW
Potassium: 4
TCO2: 23
pCO2 arterial: 35.9
pH, Arterial: 7.385

## 2011-03-05 LAB — PROTIME-INR
INR: 0.9
Prothrombin Time: 12.5

## 2011-03-05 LAB — APTT: aPTT: 28

## 2011-03-05 LAB — ABO/RH: ABO/RH(D): O NEG

## 2011-03-15 NOTE — Procedures (Unsigned)
BYPASS GRAFT EVALUATION  INDICATION:  Followup left fem below knee popliteal graft placed 12/03/2010.  HISTORY: Left CIA and EIA stenting 09/08/2010. Diabetes:  Yes. Cardiac:  Yes. Hypertension:  Yes. Smoking:  Yes. Previous Surgery:  Left fourth toe removal.  SINGLE LEVEL ARTERIAL EXAM                              RIGHT              LEFT Brachial: Anterior tibial: Posterior tibial: Peroneal: Ankle/brachial index:  PREVIOUS ABI:  Date:  09/28/2010  RIGHT:  NA  LEFT:  0.43  LOWER EXTREMITY BYPASS GRAFT DUPLEX EXAM:  DUPLEX:  Patent femoral to popliteal graft with elevated velocities at the inflow and proximal anastomosis which is difficult to visualize due to scarring and lymph nodes. Waveforms within the graft are triphasic. There appears to be a ruptured Baker's cyst in the left popliteal space with complex fluid observed.  IMPRESSION: 1. Patent femoral to popliteal graft with elevated velocities as     described above. 2. Please see attached diagram for velocity details.  ___________________________________________ V. Charlena Cross, MD  LT/MEDQ  D:  03/01/2011  T:  03/01/2011  Job:  [id number]

## 2011-05-18 ENCOUNTER — Ambulatory Visit (INDEPENDENT_AMBULATORY_CARE_PROVIDER_SITE_OTHER): Payer: Medicare Other | Admitting: Cardiovascular Disease

## 2011-05-18 ENCOUNTER — Encounter: Payer: Self-pay | Admitting: Cardiovascular Disease

## 2011-05-18 VITALS — BP 125/70 | HR 66 | Wt 177.0 lb

## 2011-05-18 DIAGNOSIS — G459 Transient cerebral ischemic attack, unspecified: Secondary | ICD-10-CM

## 2011-05-18 DIAGNOSIS — I779 Disorder of arteries and arterioles, unspecified: Secondary | ICD-10-CM

## 2011-05-18 DIAGNOSIS — Z01810 Encounter for preprocedural cardiovascular examination: Secondary | ICD-10-CM

## 2011-05-18 DIAGNOSIS — I2510 Atherosclerotic heart disease of native coronary artery without angina pectoris: Secondary | ICD-10-CM

## 2011-05-18 MED ORDER — PINDOLOL 5 MG PO TABS: 5.0000 mg | ORAL_TABLET | Freq: Two times a day (BID) | ORAL | Status: DC

## 2011-05-18 MED ORDER — CLOPIDOGREL BISULFATE 75 MG PO TABS: 75.0000 mg | ORAL_TABLET | Freq: Every day | ORAL | Status: DC

## 2011-05-18 NOTE — Assessment & Plan Note (Signed)
F/U [place].  MRSA wound is healed.  No pain in LLE  F/U ABI's in 6 months

## 2011-05-18 NOTE — Assessment & Plan Note (Signed)
Needs cataract surgery.  Given no cardiac complications with recent complicated vascular surgery will clear for this.  Change atenolol to sectral or acebutolol.  Discussed need to talk with surgeon about holding Plavix

## 2011-05-18 NOTE — Progress Notes (Signed)
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 Moderate LAD and circumflex disease. 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 Needs fem pop bypass to avoid LBKA. Limited mobility but no angina. Describes "TIA;s with numbness on left side of face. No focal deficits. Compliant with ASA and Plavix. No recent cardiac or vascular studies since 2009. Long discussion about smoking and fact that she will fail her fempop and require amputation. She does not appear to be motivated to quit. Denies palpiations, mild chronic dypnea. No syncope, diaphoresis. She has had breast CA   Reviewed nuclear from June  minimal inferior wall thinning no frank infarct or ischemia EF 61%  Reviewed carotid and 60-79% left ICA needs F/U duplex in a year June 2013  Had left fempop bypass in July complicated by calf abscess cared for by Dr Myra Gianotti  ROS: Denies fever, malais, weight loss, blurry vision, decreased visual acuity, cough, sputum, SOB, hemoptysis, pleuritic pain, palpitaitons, heartburn, abdominal pain, melena, lower extremity edema, claudication, or rash.  All other systems reviewed and negative  General: Affect appropriate Chronically ill HEENT: normal Neck supple with no adenopathy JVP normal no bruits no thyromegaly Lungs clear with no wheezing and good diaphragmatic motion Heart:  S1/S2 no murmur,rub, gallop or click PMI normal Abdomen: benighn, BS positve, no tenderness, no AAA no bruit.  No HSM or HJR S/P right BKA  S/P vascular surgery on left leg with decreased PT/DP No edema Neuro non-focal Skin warm and dry No muscular weakness   Current Outpatient Prescriptions  Medication Sig Dispense Refill  . ALPRAZolam (XANAX) 0.5 MG tablet Take 0.5 mg by mouth at bedtime as needed.        . APIDRA SOLOSTAR 100 UNIT/ML injection DAILY SLIDING SCALE      .  aspirin 81 MG tablet Take 81 mg by mouth daily.        Marland Kitchen atenolol (TENORMIN) 25 MG tablet Take 1 tablet by mouth Daily.      . B-D INS SYR ULTRAFINE 1CC/31G 31G X 5/16" 1 ML MISC       . B-D ULTRAFINE III SHORT PEN 31G X 8 MM MISC       . furosemide (LASIX) 20 MG tablet Take 20 mg by mouth as needed.       Marland Kitchen HUMULIN 70/30 70-30 % injection 55 UNITS IN THE MORNING, 65 UNITS AT NIGHT      . hyoscyamine (LEVSIN) 0.125 MG/5ML ELIX Take 0.25 mg by mouth every 6 (six) hours as needed.        . Loratadine-Pseudoephedrine (ALLERGY RELIEF D PO) Take 1 tablet by mouth daily.        . meperidine (DEMEROL) 50 MG tablet Take 50 mg by mouth every 6 (six) hours as needed.        Marland Kitchen NITROSTAT 0.4 MG SL tablet Take by mouth. AS NEEDED FOR CHEST PAINS EVERY 5 MINUTES UP TO 3 DOSES      . NON FORMULARY Alprozolam... 0.5mg  1 daily as needed         Allergies  Lidocaine; Procaine hcl; Tricor; Altace; Atacand; Codeine; Iohexol; Lyrica; Neurontin; Other; Soap; Vitamin d analogs; Lodine; and Tetracyclines & related  Electrocardiogram:  Assessment and Plan

## 2011-05-18 NOTE — Assessment & Plan Note (Signed)
Resume Plavix.  F/U carotid duplex in June

## 2011-05-18 NOTE — Assessment & Plan Note (Signed)
Stable with no angina and good activity level.  Continue medical Rx  

## 2011-05-18 NOTE — Assessment & Plan Note (Signed)
Counseled for less than 10 minutes Not motivated to quit despite COPD and PVD

## 2011-05-18 NOTE — Patient Instructions (Signed)
Your physician wants you to follow-up in: June 2013 You will receive a reminder letter in the mail two months in advance. If you don't receive a letter, please call our office to schedule the follow-up appointment.  Your physician has requested that you have a carotid duplex. This test is an ultrasound of the carotid arteries in your neck. It looks at blood flow through these arteries that supply the brain with blood. Allow one hour for this exam. There are no restrictions or special instructions. To be done in June 2013.  Your physician has recommended you make the following change in your medication: Resume Plavix 75 mg by mouth daily.   Stop atenolol. We will send prescription to your pharmacy for new medicine.

## 2011-05-31 ENCOUNTER — Ambulatory Visit (INDEPENDENT_AMBULATORY_CARE_PROVIDER_SITE_OTHER): Payer: Medicare Other | Admitting: Surgery

## 2011-05-31 ENCOUNTER — Encounter: Payer: Self-pay | Admitting: Surgery

## 2011-05-31 ENCOUNTER — Ambulatory Visit (INDEPENDENT_AMBULATORY_CARE_PROVIDER_SITE_OTHER): Payer: Medicare Other

## 2011-05-31 VITALS — BP 138/78 | HR 78 | Resp 16 | Ht 69.0 in | Wt 176.0 lb

## 2011-05-31 DIAGNOSIS — I70219 Atherosclerosis of native arteries of extremities with intermittent claudication, unspecified extremity: Secondary | ICD-10-CM

## 2011-05-31 DIAGNOSIS — Z48812 Encounter for surgical aftercare following surgery on the circulatory system: Secondary | ICD-10-CM

## 2011-05-31 DIAGNOSIS — I739 Peripheral vascular disease, unspecified: Secondary | ICD-10-CM

## 2011-05-31 NOTE — Progress Notes (Signed)
[facility] [place]   Patient name: [name] MRN: [id number] DOB: 09/04/1955 Sex: female     Chief Complaint  Patient presents with  . PVD    3 month f/up with Lab    HISTORY OF PRESENT ILLNESS: The patient is seen today as an abdominal and following abnormal vascular lab studies. She is status post left common femoral to below knee popliteal artery bypass graft with transposed non-reversed greater saphenous vein and left fourth 2" on 12/08/2010. She was readmitted with pain in her calf. She was found to have a fluid collection which was evacuated. She had positive cultures it was on IV antibiotics. Everything is now completely healed. She does not have symptoms in her left leg however she does have a scrape on her third toe which has been there for about a week and has not completely healed. She denies rest pain. Her right leg is surgically absent.  Past Medical History  Diagnosis Date  . PVD (peripheral vascular disease)   . Breast cancer     right breast, chemo and mastectomy  . CAD (coronary artery disease)     severe  . Tobacco abuse   . Diabetes mellitus   . Hyperlipidemia     Past Surgical History  Procedure Date  . Mastectomy   . Below knee leg amputation     right  . Femoral-popliteal bypass graft   . Tubal ligation   . Cholecystectomy   . Hernia repair   . Pr vein bypass graft,aorto-fem-pop   . Carotid endarterectomy 08/10/2007    History   Social History  . Marital Status: Married    Spouse Name: N/A    Number of Children: N/A  . Years of Education: N/A   Occupational History  . Not on file.   Social History Main Topics  . Smoking status: Current Everyday Smoker -- 0.8 packs/day    Types: Cigarettes    Last Attempt to Quit: 08/01/2007  . Smokeless tobacco: Never Used  . Alcohol Use: No  . Drug Use: Not on file  . Sexually Active: Not on file   Other Topics Concern  . Not on file   Social History Narrative  . No  narrative on file    Family History  Problem Relation Age of Onset  . Lung cancer Mother   . Heart disease Father   . Heart attack Maternal Grandfather     Allergies as of 05/31/2011 - Review Complete 05/31/2011  Allergen Reaction Noted  . Lidocaine Anaphylaxis 11/19/2010  . Procaine hcl (novocain) Anaphylaxis 11/19/2010  . Tricor Shortness Of Breath   . Altace Other (See Comments)   . Atacand (candesartan cilexetil) Other (See Comments)   . Codeine Other (See Comments)   . Iohexol  07/31/2007  . Lyrica (pregabalin) Other (See Comments)   . Neurontin (gabapentin) Other (See Comments)   . Other    . Soap    . Vitamin d analogs    . Lodine (etodolac) Rash   . Tetracyclines & related Rash     Current Outpatient Prescriptions on File Prior to Visit  Medication Sig Dispense Refill  . ALPRAZolam (XANAX) 0.5 MG tablet Take 0.5 mg by mouth at bedtime as needed.        . APIDRA SOLOSTAR 100 UNIT/ML injection DAILY SLIDING SCALE      . aspirin 81 MG tablet Take 81 mg by mouth daily.        . B-D INS SYR   ULTRAFINE 1CC/31G 31G X 5/16" 1 ML MISC       . B-D ULTRAFINE III SHORT PEN 31G X 8 MM MISC       . clopidogrel (PLAVIX) 75 MG tablet Take 1 tablet (75 mg total) by mouth daily.  30 tablet  11  . furosemide (LASIX) 20 MG tablet Take 20 mg by mouth as needed.       . HUMULIN 70/30 70-30 % injection 55 UNITS IN THE MORNING, 65 UNITS AT NIGHT      . hyoscyamine (LEVSIN) 0.125 MG/5ML ELIX Take 0.25 mg by mouth every 6 (six) hours as needed.        . Loratadine-Pseudoephedrine (ALLERGY RELIEF D PO) Take 1 tablet by mouth daily.        . meperidine (DEMEROL) 50 MG tablet Take 50 mg by mouth every 6 (six) hours as needed.        . NITROSTAT 0.4 MG SL tablet Take by mouth. AS NEEDED FOR CHEST PAINS EVERY 5 MINUTES UP TO 3 DOSES      . NON FORMULARY Alprozolam... 0.5mg 1 daily as needed       . pindolol (VISKEN) 5 MG tablet Take 1 tablet (5 mg total) by mouth 2 (two) times daily.  60 tablet   11     REVIEW OF SYSTEMS: No changes  t PHYSICAL EXAMINATION:   Vital signs are BP 138/78  Pulse 78  Resp 16  Ht 5' 9" (1.753 m)  Wt 176 lb (79.833 kg)  BMI 25.99 kg/m2  SpO2 97% General: The patient appears their stated age. HEENT:  No gross abnormalities Pulmonary:  Non labored breathing Musculoskeletal: Right leg and face Neurologic: No focal weakness or paresthesias are detected, Skin: small skin abrasion left third toe Psychiatric: The patient has normal affect. Cardiovascular: There is a regular rate and rhythm without significant murmur appreciated.   Diagnostic Studies  vascular lab studies show elevated velocities in the distal anastomosis of 480 cm/s  Assessment:  elevated velocities distal bypass graft anastomosis Plan:  the patient will be scheduled for angiogram of the left leg. Of note she has aortoiliac stents which will require antegrade access. This is been scheduled for Wednesday January second.  I discussed that the reason for this was to prolong the life of her bypass graft. With velocity narrowings obtained today I think her bypass graft is threatened. She is to high risk for surgical revascularization given the abscess she developed. Hopefully we can fix this percutaneously.  V. Wells Kimon Loewen IV, M.D. [facility] [place] Office: [phone] Pager:  [phone]    

## 2011-06-01 ENCOUNTER — Other Ambulatory Visit: Payer: Self-pay

## 2011-06-11 ENCOUNTER — Telehealth: Payer: Self-pay | Admitting: Cardiovascular Disease

## 2011-06-11 MED ORDER — ATENOLOL 25 MG PO TABS: 25.0000 mg | ORAL_TABLET | Freq: Every day | ORAL | Status: DC

## 2011-06-11 NOTE — Telephone Encounter (Signed)
New Msg: Pt calling stating that ever since pt started taking new BP medication pt is experiencing jerking. Pt said her entire body is twitching and she can't really explain it. Pt just started taking medication yesterday. Pt BP seems normal however pt HR was 98 and pt said she could feel her heart beating fast. Pt said she checked her sugar and it wasn't low. Please return pt call to discuss further.

## 2011-06-11 NOTE — Telephone Encounter (Signed)
Spoke with pt, when she saw dr Eden Emms he changed her atenolol to pindolol because every morning after taking the atenolol she would have sneezing and watering eyes for about 30 min. After taking the first dosage of pindolol, about 3-4 hours later she started to tremble inside and out. Her bp at that time was 121/60 and her pulse was 98. The same thing happened with the second dose of pindolol as well. The pt would like to go back to the atenolol as before. Okay given and script called to pharm, she will call back with cont problems

## 2011-06-16 ENCOUNTER — Encounter (HOSPITAL_COMMUNITY)
Admission: RE | Disposition: A | Discharge status: Home / Self Care | Payer: Self-pay | Source: Ambulatory Visit | Attending: Surgery

## 2011-06-16 ENCOUNTER — Ambulatory Visit (HOSPITAL_COMMUNITY)
Admission: RE | Admit: 2011-06-16 | Discharge: 2011-06-16 | Disposition: A | Discharge status: Home / Self Care | Payer: Medicare Other | Source: Ambulatory Visit | Attending: Surgery | Admitting: Surgery

## 2011-06-16 DIAGNOSIS — I70409 Unspecified atherosclerosis of autologous vein bypass graft(s) of the extremities, unspecified extremity: Secondary | ICD-10-CM

## 2011-06-16 DIAGNOSIS — I2510 Atherosclerotic heart disease of native coronary artery without angina pectoris: Secondary | ICD-10-CM | POA: Insufficient documentation

## 2011-06-16 DIAGNOSIS — E119 Type 2 diabetes mellitus without complications: Secondary | ICD-10-CM | POA: Insufficient documentation

## 2011-06-16 DIAGNOSIS — I70309 Unspecified atherosclerosis of unspecified type of bypass graft(s) of the extremities, unspecified extremity: Secondary | ICD-10-CM | POA: Insufficient documentation

## 2011-06-16 DIAGNOSIS — E785 Hyperlipidemia, unspecified: Secondary | ICD-10-CM | POA: Insufficient documentation

## 2011-06-16 DIAGNOSIS — F17200 Nicotine dependence, unspecified, uncomplicated: Secondary | ICD-10-CM | POA: Insufficient documentation

## 2011-06-16 HISTORY — PX: LOWER EXTREMITY ANGIOGRAM: SHX5508

## 2011-06-16 LAB — GLUCOSE, CAPILLARY: Glucose-Capillary: 397 mg/dL — ABNORMAL HIGH (ref 70–99)

## 2011-06-16 LAB — POCT ACTIVATED CLOTTING TIME
Activated Clotting Time: 259 seconds
Activated Clotting Time: 193 seconds

## 2011-06-16 SURGERY — ANGIOGRAM, LOWER EXTREMITY
Anesthesia: LOCAL | Laterality: Left

## 2011-06-16 MED ORDER — HEPARIN (PORCINE) IN NACL 2-0.9 UNIT/ML-% IJ SOLN
INTRAMUSCULAR | Status: AC
  Filled 2011-06-16: qty 1000

## 2011-06-16 MED ORDER — HEPARIN SODIUM (PORCINE) 1000 UNIT/ML IJ SOLN
INTRAMUSCULAR | Status: AC
  Filled 2011-06-16: qty 1

## 2011-06-16 MED ORDER — FAMOTIDINE IN NACL 20-0.9 MG/50ML-% IV SOLN
INTRAVENOUS | Status: AC
  Administered 2011-06-16: 20 mg
  Filled 2011-06-16: qty 50

## 2011-06-16 MED ORDER — FENTANYL CITRATE 0.05 MG/ML IJ SOLN: 25.0000 ug | INTRAMUSCULAR | Status: DC | PRN

## 2011-06-16 MED ORDER — DIPHENHYDRAMINE HCL 50 MG/ML IJ SOLN: 25.0000 mg | Freq: Once | INTRAMUSCULAR | Status: DC

## 2011-06-16 MED ORDER — ACETAMINOPHEN 325 MG PO TABS
ORAL_TABLET | ORAL | Status: AC
  Administered 2011-06-16: 650 mg via ORAL
  Filled 2011-06-16: qty 2

## 2011-06-16 MED ORDER — METHYLPREDNISOLONE SODIUM SUCC 125 MG IJ SOLR
INTRAMUSCULAR | Status: AC
  Administered 2011-06-16: 125 mg
  Filled 2011-06-16: qty 2

## 2011-06-16 MED ORDER — LIDOCAINE HCL (PF) 1 % IJ SOLN
INTRAMUSCULAR | Status: AC
  Filled 2011-06-16: qty 30

## 2011-06-16 MED ORDER — FAMOTIDINE IN NACL 20-0.9 MG/50ML-% IV SOLN: 20.0000 mg | Freq: Once | INTRAVENOUS | Status: DC

## 2011-06-16 MED ORDER — METHYLPREDNISOLONE SODIUM SUCC 125 MG IJ SOLR: 125.0000 mg | Freq: Once | INTRAMUSCULAR | Status: DC

## 2011-06-16 MED ORDER — SODIUM CHLORIDE 0.9 % IV SOLN
INTRAVENOUS | Status: DC
  Administered 2011-06-16: 1000 mL via INTRAVENOUS

## 2011-06-16 MED ORDER — FENTANYL CITRATE 0.05 MG/ML IJ SOLN
INTRAMUSCULAR | Status: AC
  Filled 2011-06-16: qty 2

## 2011-06-16 MED ORDER — SODIUM CHLORIDE 0.9 % IV SOLN: 1.0000 mL/kg/h | INTRAVENOUS | Status: DC

## 2011-06-16 MED ORDER — HYDRALAZINE HCL 20 MG/ML IJ SOLN: 10.0000 mg | Freq: Four times a day (QID) | INTRAMUSCULAR | Status: DC | PRN

## 2011-06-16 MED ORDER — DIPHENHYDRAMINE HCL 50 MG/ML IJ SOLN
INTRAMUSCULAR | Status: AC
  Administered 2011-06-16: 25 mg
  Filled 2011-06-16: qty 1

## 2011-06-16 MED ORDER — ACETAMINOPHEN 325 MG PO TABS
650.0000 mg | ORAL_TABLET | ORAL | Status: DC | PRN
  Administered 2011-06-16: 650 mg via ORAL

## 2011-06-16 MED ORDER — ONDANSETRON HCL 4 MG/2ML IJ SOLN: 4.0000 mg | Freq: Four times a day (QID) | INTRAMUSCULAR | Status: DC | PRN

## 2011-06-16 MED ORDER — MIDAZOLAM HCL 2 MG/2ML IJ SOLN
INTRAMUSCULAR | Status: AC
  Filled 2011-06-16: qty 2

## 2011-06-16 NOTE — Op Note (Signed)
[facility] [place]  Patient name: [name] MRN: [id number] DOB: [birth year] Sex: female  06/16/2011 Pre-operative Diagnosis: Bypass graft stenosis Post-operative diagnosis:  Same Surgeon:  Jorge Ny Procedure Performed:  1.  ultrasound access left femoral artery  2.  left lower extremity runoff  3.  angioplasty left popliteal artery  4.  intra-arterial administration of nitroglycerin   Indications:  The patient is previously undergone left femoral to below-knee popliteal bypass graft in the setting of a nonhealing wound. She was found on surveillance ultrasound to have elevated velocities at her distal anastomosis. She also has a new sore on her left foot. She comes in today for her procedure  Procedure:  The patient was identified in the holding area and taken to room 8.  The patient was then placed supine on the table and prepped and draped in the usual sterile fashion.  A time out was called.  Ultrasound was used to evaluate the left common femoral artery.  It was patent .  A digital ultrasound image was acquired. I proceeded with antegrade access as the patient has stents which go from aorta and to her iliac system which precludes contralateral access. Because of her pannus, skin and her scar tissue I elected to access the femoral-popliteal bypass graft directly as it appeared to be very dilated at its proximal portion. This was done under ultrasound guidance with a micropuncture needle. A micropuncture wire was then inserted followed by the micropuncture sheath. Contrast injections were performed of the left leg through the micropuncture sheath.  Findings:   Left Lower Extremity:  The bypass graft is patent throughout it's course it is small in caliber. There is a high grade, greater than 90% stenosis at the distal anastomosis. There is also native disease within the distal below-knee popliteal artery extending into the tibioperoneal trunk. There is  three-vessel runoff.  Intervention:  At upsized to a 5 [place] sheath over an 035 wire. The patient was fully heparinized. 400 mcg of nitroglycerin were then introduced through the sheath.  I then used an 014 sparta core wire to cross the lesion. I then perform balloon angioplasty using a Fox SV 2 x 30 balloon. The balloon was taken to 20 atmospheres and held for 90 seconds. Followup study revealed resolution of the bypass graft stenosis at the distal anastomosis. There remained native disease within the below knee popliteal artery and proximal tibioperoneal trunk which I elected not to address today. The runoff was similar to 3 intervention. At this point the decision was made to terminate the procedure. Catheters and wires were removed. The patient was taken to the holding area for sheath pull once her coagulation profile corrects.  Impression:  #1  successful angioplasty of the distal anastomotic bypass graft stenosis using a 2 mm balloon.  #2  three-vessel runoff  #3  stenosis remains in the below knee popliteal artery and proximal tibioperoneal trunk     V. Durene Cal, M.D. [facility] [place] Office: [phone] Pager:  [phone]

## 2011-06-16 NOTE — H&P (View-Only) (Signed)
[facility] [place]   Patient name: MARENDA [name] MRN: [id number] DOB: [birth year] Sex: female     Chief Complaint  Patient presents with  . PVD    3 month f/up with Lab    HISTORY OF PRESENT ILLNESS: The patient is seen today as an abdominal and following abnormal vascular lab studies. She is status post left common femoral to below knee popliteal artery bypass graft with transposed non-reversed greater saphenous vein and left fourth 2" on 12/08/2010. She was readmitted with pain in her calf. She was found to have a fluid collection which was evacuated. She had positive cultures it was on IV antibiotics. Everything is now completely healed. She does not have symptoms in her left leg however she does have a scrape on her third toe which has been there for about a week and has not completely healed. She denies rest pain. Her right leg is surgically absent.  Past Medical History  Diagnosis Date  . PVD (peripheral vascular disease)   . Breast cancer     right breast, chemo and mastectomy  . CAD (coronary artery disease)     severe  . Tobacco abuse   . Diabetes mellitus   . Hyperlipidemia     Past Surgical History  Procedure Date  . Mastectomy   . Below knee leg amputation     right  . Femoral-popliteal bypass graft   . Tubal ligation   . Cholecystectomy   . Hernia repair   . Pr vein bypass graft,aorto-fem-pop   . Carotid endarterectomy 08/10/2007    History   Social History  . Marital Status: Married    Spouse Name: N/A    Number of Children: N/A  . Years of Education: N/A   Occupational History  . Not on file.   Social History Main Topics  . Smoking status: Current Everyday Smoker -- 0.8 packs/day    Types: Cigarettes    Last Attempt to Quit: 08/01/2007  . Smokeless tobacco: Never Used  . Alcohol Use: No  . Drug Use: Not on file  . Sexually Active: Not on file   Other Topics Concern  . Not on file   Social History Narrative  . No  narrative on file    Family History  Problem Relation Age of Onset  . Lung cancer Mother   . Heart disease Father   . Heart attack Maternal Grandfather     Allergies as of 05/31/2011 - Review Complete 05/31/2011  Allergen Reaction Noted  . Lidocaine Anaphylaxis 11/19/2010  . Procaine hcl (novocain) Anaphylaxis 11/19/2010  . Tricor Shortness Of Breath   . Altace Other (See Comments)   . Atacand (candesartan cilexetil) Other (See Comments)   . Codeine Other (See Comments)   . Iohexol  07/31/2007  . Lyrica (pregabalin) Other (See Comments)   . Neurontin (gabapentin) Other (See Comments)   . Other    . Soap    . Vitamin d analogs    . Lodine (etodolac) Rash   . Tetracyclines & related Rash     Current Outpatient Prescriptions on File Prior to Visit  Medication Sig Dispense Refill  . ALPRAZolam (XANAX) 0.5 MG tablet Take 0.5 mg by mouth at bedtime as needed.        . APIDRA SOLOSTAR 100 UNIT/ML injection DAILY SLIDING SCALE      . aspirin 81 MG tablet Take 81 mg by mouth daily.        . B-D INS SYR  ULTRAFINE 1CC/31G 31G X 5/16" 1 ML MISC       . B-D ULTRAFINE III SHORT PEN 31G X 8 MM MISC       . clopidogrel (PLAVIX) 75 MG tablet Take 1 tablet (75 mg total) by mouth daily.  30 tablet  11  . furosemide (LASIX) 20 MG tablet Take 20 mg by mouth as needed.       Marland Kitchen HUMULIN 70/30 70-30 % injection 55 UNITS IN THE MORNING, 65 UNITS AT NIGHT      . hyoscyamine (LEVSIN) 0.125 MG/5ML ELIX Take 0.25 mg by mouth every 6 (six) hours as needed.        . Loratadine-Pseudoephedrine (ALLERGY RELIEF D PO) Take 1 tablet by mouth daily.        . meperidine (DEMEROL) 50 MG tablet Take 50 mg by mouth every 6 (six) hours as needed.        Marland Kitchen NITROSTAT 0.4 MG SL tablet Take by mouth. AS NEEDED FOR CHEST PAINS EVERY 5 MINUTES UP TO 3 DOSES      . NON FORMULARY Alprozolam... 0.5mg  1 daily as needed       . pindolol (VISKEN) 5 MG tablet Take 1 tablet (5 mg total) by mouth 2 (two) times daily.  60 tablet   11     REVIEW OF SYSTEMS: No changes  t PHYSICAL EXAMINATION:   Vital signs are BP 138/78  Pulse 78  Resp 16  Ht 5\' 9"  (1.753 m)  Wt 176 lb (79.833 kg)  BMI 25.99 kg/m2  SpO2 97% General: The patient appears their stated age. HEENT:  No gross abnormalities Pulmonary:  Non labored breathing Musculoskeletal: Right leg and face Neurologic: No focal weakness or paresthesias are detected, Skin: small skin abrasion left third toe Psychiatric: The patient has normal affect. Cardiovascular: There is a regular rate and rhythm without significant murmur appreciated.   Diagnostic Studies  vascular lab studies show elevated velocities in the distal anastomosis of 480 cm/s  Assessment:  elevated velocities distal bypass graft anastomosis Plan:  the patient will be scheduled for angiogram of the left leg. Of note she has aortoiliac stents which will require antegrade access. This is been scheduled for Wednesday January second.  I discussed that the reason for this was to prolong the life of her bypass graft. With velocity narrowings obtained today I think her bypass graft is threatened. She is to high risk for surgical revascularization given the abscess she developed. Hopefully we can fix this percutaneously.  Jorge Ny, M.D. [facility] [place] Office: [phone] Pager:  [phone]

## 2011-06-16 NOTE — Interval H&P Note (Signed)
History and Physical Interval Note:  06/16/2011 7:18 AM  [name]  has presented today for surgery, with the diagnosis of PVD  The various methods of treatment have been discussed with the patient and family. After consideration of risks, benefits and other options for treatment, the patient has consented to  Procedure(s): LOWER EXTREMITY ANGIOGRAM as a surgical intervention .  The patients' history has been reviewed, patient examined, no change in status, stable for surgery.  I have reviewed the patients' chart and labs.  Questions were answered to the patient's satisfaction.     BRABHAM IV, V. WELLS

## 2011-06-17 LAB — POCT I-STAT, CHEM 8
HCT: 43 % (ref 36.0–46.0)
Hemoglobin: 14.6 g/dL (ref 12.0–15.0)
Potassium: 4 mEq/L (ref 3.5–5.1)
Sodium: 138 mEq/L (ref 135–145)
TCO2: 26 mmol/L (ref 0–100)

## 2011-06-17 LAB — POCT ACTIVATED CLOTTING TIME: Activated Clotting Time: 177 seconds

## 2011-07-05 NOTE — Procedures (Unsigned)
LOWER EXTREMITY ARTERIAL EVALUATION-SINGLE LEVEL  INDICATION:  Follow-up evaluation.  HISTORY: Diabetes:  Yes. Cardiac:  Yes. Hypertension:  Yes. Smoking:  Yes. Previous Surgery:  Left 4th toe removal, Left CIA and EIA stenting, 09/08/2010.  RESTING SYSTOLIC PRESSURES: (ABI)                         RIGHT                LEFT Brachial:                                    130 Anterior tibial: Posterior tibial: Peroneal: DOPPLER WAVEFORM ANALYSIS: Anterior tibial: Posterior tibial: Peroneal:  PREVIOUS ABI'S:  Date:  03/01/2011  RIGHT:  Nonapplicable  LEFT:  0.83.  DUPLEX:  Patent left fem-pop bypass graft with severe increase of velocity at the distal anastomosis of 480 cm/s.  IMPRESSION: 1. Severe increased velocity at the distal anastomosis on the left. 2. Decreased in left ankle brachial index since prior examination of     03/01/2011.   Dictated by Elita Quick, RVT  ___________________________________________ V. Charlena Cross, MD  CI/MEDQ  D:  05/31/2011  T:  05/31/2011  Job:  [id number]

## 2011-07-16 ENCOUNTER — Encounter: Payer: Self-pay | Admitting: Surgery

## 2011-07-19 ENCOUNTER — Encounter: Payer: Self-pay | Admitting: Surgery

## 2011-07-19 ENCOUNTER — Ambulatory Visit (INDEPENDENT_AMBULATORY_CARE_PROVIDER_SITE_OTHER): Payer: Medicare Other | Admitting: Surgery

## 2011-07-19 VITALS — BP 145/79 | HR 87 | Resp 16 | Ht 69.0 in | Wt 178.0 lb

## 2011-07-19 DIAGNOSIS — I743 Embolism and thrombosis of arteries of the lower extremities: Secondary | ICD-10-CM

## 2011-07-19 NOTE — Progress Notes (Signed)
[facility] [place]   Patient name: [name] MRN: [id number] DOB: April 29, 1956 Sex: female     Chief Complaint  Patient presents with  . Follow-up    Bypass graft stenosis/angio    HISTORY OF PRESENT ILLNESS: The patient is back today for followup. She is recently status post angioplasty of a below knee popliteal artery bypass graft distal anastomotic stenosis that was detected with duplex ultrasound. For that I saw her she has minimal complaints. She does complain of some tenderness in the popliteal fossa. She had a ulcer on her toe that has had a scab come off of the tissue looked healthy underneath.  Past Medical History  Diagnosis Date  . PVD (peripheral vascular disease)   . Breast cancer     right breast, chemo and mastectomy  . CAD (coronary artery disease)     severe  . Tobacco abuse   . Diabetes mellitus   . Hyperlipidemia     Past Surgical History  Procedure Date  . Mastectomy   . Below knee leg amputation     right  . Femoral-popliteal bypass graft   . Tubal ligation   . Cholecystectomy   . Hernia repair   . Pr vein bypass graft,aorto-fem-pop   . Carotid endarterectomy 08/10/2007    History   Social History  . Marital Status: Married    Spouse Name: N/A    Number of Children: N/A  . Years of Education: N/A   Occupational History  . Not on file.   Social History Main Topics  . Smoking status: Current Everyday Smoker -- 0.8 packs/day    Types: Cigarettes    Last Attempt to Quit: 08/01/2007  . Smokeless tobacco: Never Used  . Alcohol Use: No  . Drug Use: Not on file  . Sexually Active: Not on file   Other Topics Concern  . Not on file   Social History Narrative  . No narrative on file    Family History  Problem Relation Age of Onset  . Lung cancer Mother   . Heart disease Father   . Heart attack Maternal Grandfather     Allergies as of 07/19/2011 - Review Complete 07/19/2011  Allergen Reaction Noted  .  Lidocaine Anaphylaxis 11/19/2010  . Procaine hcl (novocain) Anaphylaxis 11/19/2010  . Tricor Shortness Of Breath   . Altace Other (See Comments)   . Atacand (candesartan cilexetil) Other (See Comments)   . Codeine Other (See Comments)   . Iohexol  07/31/2007  . Lyrica (pregabalin) Other (See Comments)   . Neurontin (gabapentin) Other (See Comments)   . Other    . Soap    . Vitamin d analogs    . Lodine (etodolac) Rash   . Tetracyclines & related Rash     Current Outpatient Prescriptions on File Prior to Visit  Medication Sig Dispense Refill  . ALPRAZolam (XANAX) 0.5 MG tablet Take 0.5 mg by mouth at bedtime as needed.        Jones Skene SOLOSTAR 100 UNIT/ML injection Inject 4-16 Units into the skin 3 (three) times daily before meals. DAILY SLIDING SCALE      . aspirin 81 MG tablet Take 81 mg by mouth daily.        Marland Kitchen atenolol (TENORMIN) 25 MG tablet Take 1 tablet (25 mg total) by mouth daily.  30 tablet  12  . B-D INS SYR ULTRAFINE 1CC/31G 31G X 5/16" 1 ML MISC       .  B-D ULTRAFINE III SHORT PEN 31G X 8 MM MISC       . clopidogrel (PLAVIX) 75 MG tablet Take 1 tablet (75 mg total) by mouth daily.  30 tablet  11  . HUMULIN 70/30 70-30 % injection 55 UNITS IN THE MORNING, 65 UNITS AT NIGHT      . hyoscyamine (LEVSIN) 0.125 MG/5ML ELIX Take 0.25 mg by mouth every 6 (six) hours as needed.        . Loratadine-Pseudoephedrine (ALLERGY RELIEF D PO) Take 1 tablet by mouth daily.        . meperidine (DEMEROL) 50 MG tablet Take 50 mg by mouth every 6 (six) hours as needed.        Marland Kitchen NITROSTAT 0.4 MG SL tablet Take by mouth. AS NEEDED FOR CHEST PAINS EVERY 5 MINUTES UP TO 3 DOSES         REVIEW OF SYSTEMS: No changes  PHYSICAL EXAMINATION:   Vital signs are BP 145/79  Pulse 87  Resp 16  Ht 5\' 9"  (1.753 m)  Wt 178 lb (80.74 kg)  BMI 26.29 kg/m2  SpO2 98% General: The patient appears their stated age. HEENT:  No gross abnormalities Pulmonary:  Non labored breathing Musculoskeletal: Right  leg is surgically absent Neurologic: No focal weakness or paresthesias are detected, Skin: There are no ulcer or rashes noted. A scab on the left toe is come off and there is healthy tissue underneath Psychiatric: The patient has normal affect. Cardiovascular: Pulses are not palpable   Diagnostic Studies None  Assessment: Status post angioplasty left distal anastomosis, femoropopliteal bypass graft Plan: The patient did not get an ultrasound today and is now with dysphagia have this done. Overall think she is doing well feel at her foot is adequately perfused. I'll have her come back to see me in 3 months with a duplex I again discussed the importance of smoking cessation   V. Charlena Cross, M.D. [facility] [place] Office: [phone] Pager:  [phone]

## 2011-08-30 ENCOUNTER — Other Ambulatory Visit: Payer: Medicare Other

## 2011-08-30 ENCOUNTER — Ambulatory Visit: Payer: Medicare Other | Admitting: Surgery

## 2011-10-12 ENCOUNTER — Other Ambulatory Visit: Payer: Self-pay | Admitting: *Deleted

## 2011-10-12 DIAGNOSIS — I70219 Atherosclerosis of native arteries of extremities with intermittent claudication, unspecified extremity: Secondary | ICD-10-CM

## 2011-10-12 DIAGNOSIS — Z48812 Encounter for surgical aftercare following surgery on the circulatory system: Secondary | ICD-10-CM

## 2011-10-14 ENCOUNTER — Encounter: Payer: Self-pay | Admitting: Surgery

## 2011-10-18 ENCOUNTER — Encounter: Payer: Self-pay | Admitting: Surgery

## 2011-10-18 ENCOUNTER — Ambulatory Visit (INDEPENDENT_AMBULATORY_CARE_PROVIDER_SITE_OTHER): Payer: Medicare Other | Admitting: Surgery

## 2011-10-18 ENCOUNTER — Ambulatory Visit (INDEPENDENT_AMBULATORY_CARE_PROVIDER_SITE_OTHER): Payer: Medicare Other | Admitting: Vascular Surgery

## 2011-10-18 VITALS — BP 134/50 | HR 87 | Temp 98.4°F | Ht 69.0 in | Wt 178.0 lb

## 2011-10-18 DIAGNOSIS — I743 Embolism and thrombosis of arteries of the lower extremities: Secondary | ICD-10-CM

## 2011-10-18 DIAGNOSIS — I70219 Atherosclerosis of native arteries of extremities with intermittent claudication, unspecified extremity: Secondary | ICD-10-CM

## 2011-10-18 DIAGNOSIS — Z48812 Encounter for surgical aftercare following surgery on the circulatory system: Secondary | ICD-10-CM

## 2011-10-18 DIAGNOSIS — I739 Peripheral vascular disease, unspecified: Secondary | ICD-10-CM

## 2011-10-18 NOTE — Progress Notes (Signed)
[facility] [place]   Patient name: [name] MRN: [id number] DOB: 05/17/1956 Sex: female     Chief Complaint  Patient presents with  . Re-evaluation    PVD, 6  month f/u     HISTORY OF PRESENT ILLNESS: The patient comes in today for followup. She is status post left femoral to below knee popliteal artery bypass graft with saphenous vein as well as left second amputation on 12/08/2010. She underwent angioplasty of the distal anastomosis in January of 2013. She's back today for routine surveillance. She has no complaints at this time. All of her wounds have healed.   Past Medical History  Diagnosis Date  . PVD (peripheral vascular disease)   . Breast cancer     right breast, chemo and mastectomy  . CAD (coronary artery disease)     severe  . Tobacco abuse   . Diabetes mellitus   . Hyperlipidemia     Past Surgical History  Procedure Date  . Mastectomy   . Below knee leg amputation     right  . Femoral-popliteal bypass graft   . Tubal ligation   . Cholecystectomy   . Hernia repair   . Pr vein bypass graft,aorto-fem-pop   . Carotid endarterectomy 08/10/2007    History   Social History  . Marital Status: Married    Spouse Name: N/A    Number of Children: N/A  . Years of Education: N/A   Occupational History  . Not on file.   Social History Main Topics  . Smoking status: Current Everyday Smoker -- 0.8 packs/day    Types: Cigarettes    Last Attempt to Quit: 08/01/2007  . Smokeless tobacco: Never Used  . Alcohol Use: No  . Drug Use: Not on file  . Sexually Active: Not on file   Other Topics Concern  . Not on file   Social History Narrative  . No narrative on file    Family History  Problem Relation Age of Onset  . Lung cancer Mother   . Heart disease Father   . Heart attack Maternal Grandfather     Allergies as of 10/18/2011 - Review Complete 10/18/2011  Allergen Reaction Noted  . Fenofibrate Shortness Of Breath   .  Lidocaine Anaphylaxis 11/19/2010  . Procaine hcl (procaine hcl) Anaphylaxis 11/19/2010  . Atacand (candesartan cilexetil) Other (See Comments)   . Codeine Other (See Comments)   . Iohexol  07/31/2007  . Lyrica (pregabalin) Other (See Comments)   . Neurontin (gabapentin) Other (See Comments)   . Other    . Ramipril Other (See Comments)   . Soap    . Vitamin d analogs    . Lodine (etodolac) Rash   . Tetracyclines & related Rash     Current Outpatient Prescriptions on File Prior to Visit  Medication Sig Dispense Refill  . ALPRAZolam (XANAX) 0.5 MG tablet Take 0.5 mg by mouth at bedtime as needed.        . APIDRA SOLOSTAR 100 UNIT/ML injection Inject 4-16 Units into the skin 3 (three) times daily before meals. DAILY SLIDING SCALE      . aspirin 81 MG tablet Take 81 mg by mouth daily.        . atenolol (TENORMIN) 25 MG tablet Take 1 tablet (25 mg total) by mouth daily.  30 tablet  12  . B-D INS SYR ULTRAFINE 1CC/31G 31G X 5/16" 1 ML MISC       . B-D ULTRAFINE III   SHORT PEN 31G X 8 MM MISC       . clopidogrel (PLAVIX) 75 MG tablet Take 1 tablet (75 mg total) by mouth daily.  30 tablet  11  . HUMULIN 70/30 70-30 % injection 55 UNITS IN THE MORNING, 65 UNITS AT NIGHT      . hyoscyamine (LEVSIN) 0.125 MG/5ML ELIX Take 0.25 mg by mouth every 6 (six) hours as needed.        . Loratadine-Pseudoephedrine (ALLERGY RELIEF D PO) Take 1 tablet by mouth daily.        . meperidine (DEMEROL) 50 MG tablet Take 50 mg by mouth every 6 (six) hours as needed.        . NITROSTAT 0.4 MG SL tablet Take by mouth. AS NEEDED FOR CHEST PAINS EVERY 5 MINUTES UP TO 3 DOSES         REVIEW OF SYSTEMS: No change from prior visit   PHYSICAL EXAMINATION:   Vital signs are BP 134/50  Pulse 87  Temp(Src) 98.4 F (36.9 C) (Oral)  Ht 5' 9" (1.753 m)  Wt 178 lb (80.74 kg)  BMI 26.29 kg/m2 General: The patient appears their stated age. HEENT:  No gross abnormalities Pulmonary:  Non labored  breathing Musculoskeletal: Right leg amputation  Neurologic: No focal weakness or paresthesias are detected, Skin: There are no ulcer or rashes noted. Psychiatric: The patient has normal affect. Cardiovascular: There is a regular rate and rhythm without significant murmur appreciated.   Diagnostic Studies Duplex ultrasound today reveals elevated velocities at the proximal anastomosis in the native femoral artery, they measured 491 cm/s. There is also disease within the tibial vessels below the distal anastomosis. Her ankle-brachial index has decreased from her prior study it is now 0.44   Assessment: Status post left femoral to below knee popliteal artery bypass graft  Plan:  the patient has elevated velocities proximally. I think based on the velocity profile as well as a decrease in ankle brachial indices, I think she needs to undergo arteriogram with the plans for intervention in the proximal anastomosis and potentially at the distal anastomosis. I again stressed the importance of smoking cessation. Our plan on accessing her right groin. This is been scheduled for Tuesday, May 14  V. Wells Yoanna Jurczyk IV, M.D. [facility] [place] Office: [phone] Pager:  [phone]   

## 2011-10-22 ENCOUNTER — Other Ambulatory Visit: Payer: Self-pay

## 2011-10-25 ENCOUNTER — Encounter (HOSPITAL_COMMUNITY): Payer: Self-pay

## 2011-10-26 ENCOUNTER — Other Ambulatory Visit: Payer: Self-pay | Admitting: *Deleted

## 2011-10-26 ENCOUNTER — Encounter (HOSPITAL_COMMUNITY)
Admission: RE | Disposition: A | Discharge status: Home / Self Care | Payer: Self-pay | Source: Ambulatory Visit | Attending: Surgery

## 2011-10-26 ENCOUNTER — Other Ambulatory Visit: Payer: Self-pay

## 2011-10-26 ENCOUNTER — Ambulatory Visit (HOSPITAL_COMMUNITY)
Admission: RE | Admit: 2011-10-26 | Discharge: 2011-10-26 | Disposition: A | Discharge status: Home / Self Care | Payer: Medicare Other | Source: Ambulatory Visit | Attending: Surgery | Admitting: Surgery

## 2011-10-26 ENCOUNTER — Telehealth: Payer: Self-pay | Admitting: Surgery

## 2011-10-26 DIAGNOSIS — I70209 Unspecified atherosclerosis of native arteries of extremities, unspecified extremity: Secondary | ICD-10-CM | POA: Insufficient documentation

## 2011-10-26 DIAGNOSIS — I10 Essential (primary) hypertension: Secondary | ICD-10-CM | POA: Insufficient documentation

## 2011-10-26 DIAGNOSIS — I2510 Atherosclerotic heart disease of native coronary artery without angina pectoris: Secondary | ICD-10-CM | POA: Insufficient documentation

## 2011-10-26 DIAGNOSIS — Z853 Personal history of malignant neoplasm of breast: Secondary | ICD-10-CM | POA: Insufficient documentation

## 2011-10-26 DIAGNOSIS — Z48812 Encounter for surgical aftercare following surgery on the circulatory system: Secondary | ICD-10-CM

## 2011-10-26 DIAGNOSIS — F17200 Nicotine dependence, unspecified, uncomplicated: Secondary | ICD-10-CM | POA: Insufficient documentation

## 2011-10-26 DIAGNOSIS — E119 Type 2 diabetes mellitus without complications: Secondary | ICD-10-CM | POA: Insufficient documentation

## 2011-10-26 DIAGNOSIS — I70219 Atherosclerosis of native arteries of extremities with intermittent claudication, unspecified extremity: Secondary | ICD-10-CM

## 2011-10-26 DIAGNOSIS — S88119A Complete traumatic amputation at level between knee and ankle, unspecified lower leg, initial encounter: Secondary | ICD-10-CM | POA: Insufficient documentation

## 2011-10-26 HISTORY — PX: ABDOMINAL AORTAGRAM: SHX5454

## 2011-10-26 LAB — POCT ACTIVATED CLOTTING TIME: Activated Clotting Time: 210 seconds

## 2011-10-26 LAB — POCT I-STAT, CHEM 8
Calcium, Ion: 1.15 mmol/L (ref 1.12–1.32)
Creatinine, Ser: 0.5 mg/dL (ref 0.50–1.10)
Glucose, Bld: 200 mg/dL — ABNORMAL HIGH (ref 70–99)
Hemoglobin: 15 g/dL (ref 12.0–15.0)
Potassium: 3.9 mEq/L (ref 3.5–5.1)
TCO2: 22 mmol/L (ref 0–100)

## 2011-10-26 SURGERY — ABDOMINAL AORTAGRAM
Anesthesia: LOCAL

## 2011-10-26 MED ORDER — ACETAMINOPHEN 325 MG PO TABS
650.0000 mg | ORAL_TABLET | ORAL | Status: DC | PRN
  Administered 2011-10-26: 650 mg via ORAL

## 2011-10-26 MED ORDER — LIDOCAINE HCL (PF) 1 % IJ SOLN
INTRAMUSCULAR | Status: AC
  Filled 2011-10-26: qty 30

## 2011-10-26 MED ORDER — DIPHENHYDRAMINE HCL 50 MG/ML IJ SOLN
INTRAMUSCULAR | Status: AC
  Filled 2011-10-26: qty 1

## 2011-10-26 MED ORDER — DIPHENHYDRAMINE HCL 50 MG/ML IJ SOLN
25.0000 mg | INTRAMUSCULAR | Status: AC
  Administered 2011-10-26: 08:00:00 via INTRAVENOUS

## 2011-10-26 MED ORDER — BUPIVACAINE HCL (PF) 0.25 % IJ SOLN
INTRAMUSCULAR | Status: AC
  Filled 2011-10-26: qty 30

## 2011-10-26 MED ORDER — SODIUM CHLORIDE 0.9 % IV SOLN: 1.0000 mL/kg/h | INTRAVENOUS | Status: DC

## 2011-10-26 MED ORDER — METHYLPREDNISOLONE SODIUM SUCC 125 MG IJ SOLR
INTRAMUSCULAR | Status: AC
  Administered 2011-10-26: 125 mg via INTRAVENOUS
  Filled 2011-10-26: qty 2

## 2011-10-26 MED ORDER — MIDAZOLAM HCL 2 MG/2ML IJ SOLN
INTRAMUSCULAR | Status: AC
  Filled 2011-10-26: qty 2

## 2011-10-26 MED ORDER — OXYCODONE-ACETAMINOPHEN 5-325 MG PO TABS: 2.0000 | ORAL_TABLET | Freq: Four times a day (QID) | ORAL | Status: DC | PRN

## 2011-10-26 MED ORDER — FAMOTIDINE IN NACL 20-0.9 MG/50ML-% IV SOLN
INTRAVENOUS | Status: AC
  Administered 2011-10-26: 20 mg
  Filled 2011-10-26: qty 50

## 2011-10-26 MED ORDER — SODIUM CHLORIDE 0.9 % IV SOLN
INTRAVENOUS | Status: DC
  Administered 2011-10-26: 07:00:00 via INTRAVENOUS

## 2011-10-26 MED ORDER — ONDANSETRON HCL 4 MG/2ML IJ SOLN: 4.0000 mg | Freq: Four times a day (QID) | INTRAMUSCULAR | Status: DC | PRN

## 2011-10-26 MED ORDER — FENTANYL CITRATE 0.05 MG/ML IJ SOLN
INTRAMUSCULAR | Status: AC
  Filled 2011-10-26: qty 2

## 2011-10-26 MED ORDER — FAMOTIDINE IN NACL 20-0.9 MG/50ML-% IV SOLN
20.0000 mg | INTRAVENOUS | Status: AC
  Administered 2011-10-26: 20 mg via INTRAVENOUS

## 2011-10-26 MED ORDER — ACETAMINOPHEN 325 MG PO TABS
ORAL_TABLET | ORAL | Status: AC
  Filled 2011-10-26: qty 2

## 2011-10-26 MED ORDER — HEPARIN SODIUM (PORCINE) 1000 UNIT/ML IJ SOLN
INTRAMUSCULAR | Status: AC
  Filled 2011-10-26: qty 1

## 2011-10-26 MED ORDER — METHYLPREDNISOLONE SODIUM SUCC 125 MG IJ SOLR
125.0000 mg | INTRAMUSCULAR | Status: AC
  Administered 2011-10-26: 125 mg via INTRAVENOUS

## 2011-10-26 MED ORDER — HYDRALAZINE HCL 20 MG/ML IJ SOLN: 10.0000 mg | INTRAMUSCULAR | Status: DC | PRN

## 2011-10-26 MED ORDER — HEPARIN (PORCINE) IN NACL 2-0.9 UNIT/ML-% IJ SOLN
INTRAMUSCULAR | Status: AC
  Filled 2011-10-26: qty 1000

## 2011-10-26 NOTE — Interval H&P Note (Signed)
History and Physical Interval Note:  10/26/2011 8:28 AM  [name]  has presented today for surgery, with the diagnosis of pvd  The various methods of treatment have been discussed with the patient and family. After consideration of risks, benefits and other options for treatment, the patient has consented to  Procedure(s) (LRB): ABDOMINAL AORTAGRAM (N/A) as a surgical intervention .  The patients' history has been reviewed, patient examined, no change in status, stable for surgery.  I have reviewed the patients' chart and labs.  Questions were answered to the patient's satisfaction.     Glenette Bookwalter IV, V. WELLS

## 2011-10-26 NOTE — Telephone Encounter (Addendum)
Message copied by Shari Prows on Tue Oct 26, 2011 11:19 AM ------      Message from: Melene Plan      Created: Tue Oct 26, 2011 10:24 AM                   ----- Message -----         From: Nada Libman, MD         Sent: 10/26/2011   9:56 AM           To: Almetta Lovely, RN            10/26/2011, the patient had the following procedures:       1.  ultrasound access left common femoral artery (antegrade)        2.  left leg runoff       3.  second order catheterization       4.  angioplasty left femoral-popliteal bypass graft and native popliteal artery            Please have the patient coming to see me in 3 months with a duplex ultrasound of the left leg with ABI I scheduled an appt for pt on Monday 01/24/12 at 1pm. I left message on pt's voicemail at home and mailed appt letter. Jacklyn Shell

## 2011-10-26 NOTE — Discharge Instructions (Signed)

## 2011-10-26 NOTE — H&P (View-Only) (Signed)
[facility] [place]   Patient name: [name] MRN: [id number] DOB: 03-Feb-1956 Sex: female     Chief Complaint  Patient presents with  . Re-evaluation    PVD, 6  month f/u     HISTORY OF PRESENT ILLNESS: The patient comes in today for followup. She is status post left femoral to below knee popliteal artery bypass graft with saphenous vein as well as left second amputation on 12/08/2010. She underwent angioplasty of the distal anastomosis in January of 2013. She's back today for routine surveillance. She has no complaints at this time. All of her wounds have healed.   Past Medical History  Diagnosis Date  . PVD (peripheral vascular disease)   . Breast cancer     right breast, chemo and mastectomy  . CAD (coronary artery disease)     severe  . Tobacco abuse   . Diabetes mellitus   . Hyperlipidemia     Past Surgical History  Procedure Date  . Mastectomy   . Below knee leg amputation     right  . Femoral-popliteal bypass graft   . Tubal ligation   . Cholecystectomy   . Hernia repair   . Pr vein bypass graft,aorto-fem-pop   . Carotid endarterectomy 08/10/2007    History   Social History  . Marital Status: Married    Spouse Name: N/A    Number of Children: N/A  . Years of Education: N/A   Occupational History  . Not on file.   Social History Main Topics  . Smoking status: Current Everyday Smoker -- 0.8 packs/day    Types: Cigarettes    Last Attempt to Quit: 08/01/2007  . Smokeless tobacco: Never Used  . Alcohol Use: No  . Drug Use: Not on file  . Sexually Active: Not on file   Other Topics Concern  . Not on file   Social History Narrative  . No narrative on file    Family History  Problem Relation Age of Onset  . Lung cancer Mother   . Heart disease Father   . Heart attack Maternal Grandfather     Allergies as of 10/18/2011 - Review Complete 10/18/2011  Allergen Reaction Noted  . Fenofibrate Shortness Of Breath   .  Lidocaine Anaphylaxis 11/19/2010  . Procaine hcl (procaine hcl) Anaphylaxis 11/19/2010  . Atacand (candesartan cilexetil) Other (See Comments)   . Codeine Other (See Comments)   . Iohexol  07/31/2007  . Lyrica (pregabalin) Other (See Comments)   . Neurontin (gabapentin) Other (See Comments)   . Other    . Ramipril Other (See Comments)   . Soap    . Vitamin d analogs    . Lodine (etodolac) Rash   . Tetracyclines & related Rash     Current Outpatient Prescriptions on File Prior to Visit  Medication Sig Dispense Refill  . ALPRAZolam (XANAX) 0.5 MG tablet Take 0.5 mg by mouth at bedtime as needed.        Jones Skene SOLOSTAR 100 UNIT/ML injection Inject 4-16 Units into the skin 3 (three) times daily before meals. DAILY SLIDING SCALE      . aspirin 81 MG tablet Take 81 mg by mouth daily.        Marland Kitchen atenolol (TENORMIN) 25 MG tablet Take 1 tablet (25 mg total) by mouth daily.  30 tablet  12  . B-D INS SYR ULTRAFINE 1CC/31G 31G X 5/16" 1 ML MISC       . B-D ULTRAFINE III  SHORT PEN 31G X 8 MM MISC       . clopidogrel (PLAVIX) 75 MG tablet Take 1 tablet (75 mg total) by mouth daily.  30 tablet  11  . HUMULIN 70/30 70-30 % injection 55 UNITS IN THE MORNING, 65 UNITS AT NIGHT      . hyoscyamine (LEVSIN) 0.125 MG/5ML ELIX Take 0.25 mg by mouth every 6 (six) hours as needed.        . Loratadine-Pseudoephedrine (ALLERGY RELIEF D PO) Take 1 tablet by mouth daily.        . meperidine (DEMEROL) 50 MG tablet Take 50 mg by mouth every 6 (six) hours as needed.        Marland Kitchen NITROSTAT 0.4 MG SL tablet Take by mouth. AS NEEDED FOR CHEST PAINS EVERY 5 MINUTES UP TO 3 DOSES         REVIEW OF SYSTEMS: No change from prior visit   PHYSICAL EXAMINATION:   Vital signs are BP 134/50  Pulse 87  Temp(Src) 98.4 F (36.9 C) (Oral)  Ht 5\' 9"  (1.753 m)  Wt 178 lb (80.74 kg)  BMI 26.29 kg/m2 General: The patient appears their stated age. HEENT:  No gross abnormalities Pulmonary:  Non labored  breathing Musculoskeletal: Right leg amputation  Neurologic: No focal weakness or paresthesias are detected, Skin: There are no ulcer or rashes noted. Psychiatric: The patient has normal affect. Cardiovascular: There is a regular rate and rhythm without significant murmur appreciated.   Diagnostic Studies Duplex ultrasound today reveals elevated velocities at the proximal anastomosis in the native femoral artery, they measured 491 cm/s. There is also disease within the tibial vessels below the distal anastomosis. Her ankle-brachial index has decreased from her prior study it is now 0.44   Assessment: Status post left femoral to below knee popliteal artery bypass graft  Plan:  the patient has elevated velocities proximally. I think based on the velocity profile as well as a decrease in ankle brachial indices, I think she needs to undergo arteriogram with the plans for intervention in the proximal anastomosis and potentially at the distal anastomosis. I again stressed the importance of smoking cessation. Our plan on accessing her right groin. This is been scheduled for Tuesday, May 14  V. Charlena Cross, M.D. [facility] [place] Office: [phone] Pager:  [phone]

## 2011-10-26 NOTE — Op Note (Signed)
[facility] [name]  Patient name: [name] MRN: [id number] DOB: 10/21/1955 Sex: female  10/26/2011 Pre-operative Diagnosis: Bypass graft stenosis, left leg Post-operative diagnosis:  Same Surgeon:  Jorge Ny Procedure Performed:  1.  ultrasound access left common femoral artery (antegrade)   2.  left leg runoff  3.  second order catheterization  4.  angioplasty left femoral-popliteal bypass graft and native popliteal artery    Indications:  The patient was seen for routine surveillance ultrasound and found to have elevated velocities at the proximal anastomosis as well as distally in the native tibial vessels. She comes in today for arteriogram.  Procedure:  The patient was identified in the holding area and taken to room 8.  The patient was then placed supine on the table and prepped and draped in the usual sterile fashion.  A time out was called.  Ultrasound was used to evaluate the left common femoral artery.  It was patent .  A digital ultrasound image was acquired.  A micropuncture needle was used to access the left common femoral artery under ultrasound guidance.  An 018 wire was advanced without resistance and a micropuncture sheath was placed.  This was done in an antegrade fashion to 2 iliac stents. I did try to access the right common femoral artery however this appeared to be occluded I then placed a Amplatz superstiff wire through the micropuncture sheath. The subcutaneous tract was dilated with a 6 [place] dilator and a 5 French sheath was placed. Contrast injections were performed through the sheath which was situated in the bypass graft. Findings:    Left Lower Extremity:  The femoral-popliteal bypass graft is patent throughout it's course. There appears to be significant common femoral disease which extends up to the femoral head and down into the origin the bypass graft. The proximal anastomosis does appear to be widely patent. At the distal  anastomosis there is approximately a 90% stenosis as well as stenosis within the distal below-knee popliteal artery. There is three-vessel runoff to the foot.  Intervention:  At this point the decision was made to intervene. The patient was fully heparinized. An 014 wire was then advanced across the lesion with the support of a Fox SV 2 x 30 balloon. I then performed a primary balloon angioplasty with the balloon across the distal anastomosis and into the native popliteal artery down to the origin of the anterior tibial artery. The balloon was taken to 12 atmospheres and held for 2 minutes. A completion arteriogram revealed significantly improved blood flow across the distal anastomosis and native popliteal artery. There was preservation of three-vessel runoff. At this point the decision was made to terminate the procedure. The catheters and wires were removed. The patient will be taken to the holding area for sheath pull once surgical isolation profile corrects.  Impression:  #1  successful balloon and plasty of the distal anastomosis and native popliteal artery using a 2 mm Fox SV balloon  #2  residual common femoral artery disease that is not well treated via an antegrade access. Recommend a brachial approach versus surgical endarterectomy.   Juleen China, M.D. [facility] [place] Office: [phone] Pager:  [phone]

## 2011-11-01 NOTE — Procedures (Unsigned)
BYPASS GRAFT EVALUATION  INDICATION:  Peripheral vascular disease  HISTORY: Diabetes:  Yes Cardiac:  CAD Hypertension:  Yes Smoking:  Currently Previous Surgery:  Left common iliac and external iliac percutaneous transluminal angioplasty with stent and left fourth digit amputation on 09/08/2010; left femoral to popliteal artery bypass on 12/03/2010  SINGLE LEVEL ARTERIAL EXAM                              RIGHT              LEFT Brachial: Anterior tibial: Posterior tibial: Peroneal: Ankle/brachial index:        N/A                0.44  PREVIOUS ABI:  Date:  05/31/2011  RIGHT:  N/V  LEFT:  0.83  LOWER EXTREMITY BYPASS GRAFT DUPLEX EXAM:  DUPLEX:  Elevated velocity present involving the left common femoral artery suggesting a >75% stenosis. Elevated velocity present with a ratio of 4.3 suggesting >75% stenosis involving the left tibial peroneal trunk.  IMPRESSION: 1. Patent left femoral to popliteal artery bypass graft. 2. Native artery stenosis present as noted above. 3. Right ankle brachial index not obtained due to history of below     knee amputation. 4.  Left ankle brachial index is 0.44 and considered in the moderate     to severe claudication range. 5. Significant decrease in the ankle brachial index since previous     study on 05/31/2011.  ___________________________________________ V. Charlena Cross, MD  SH/MEDQ  D:  10/18/2011  T:  10/18/2011  Job:  [id number]

## 2011-11-24 ENCOUNTER — Ambulatory Visit: Payer: Medicare Other | Admitting: Cardiovascular Disease

## 2011-12-24 ENCOUNTER — Ambulatory Visit: Payer: Medicare Other | Admitting: Cardiovascular Disease

## 2012-01-21 ENCOUNTER — Encounter: Payer: Self-pay | Admitting: Surgery

## 2012-01-24 ENCOUNTER — Ambulatory Visit (INDEPENDENT_AMBULATORY_CARE_PROVIDER_SITE_OTHER): Payer: Medicare Other | Admitting: Surgery

## 2012-01-24 ENCOUNTER — Encounter (INDEPENDENT_AMBULATORY_CARE_PROVIDER_SITE_OTHER): Payer: Medicare Other | Admitting: *Deleted

## 2012-01-24 ENCOUNTER — Encounter: Payer: Self-pay | Admitting: Surgery

## 2012-01-24 VITALS — BP 125/79 | HR 82 | Resp 16 | Ht 69.0 in | Wt 173.0 lb

## 2012-01-24 DIAGNOSIS — I70219 Atherosclerosis of native arteries of extremities with intermittent claudication, unspecified extremity: Secondary | ICD-10-CM

## 2012-01-24 DIAGNOSIS — I739 Peripheral vascular disease, unspecified: Secondary | ICD-10-CM

## 2012-01-24 DIAGNOSIS — Z48812 Encounter for surgical aftercare following surgery on the circulatory system: Secondary | ICD-10-CM

## 2012-01-24 NOTE — Addendum Note (Signed)
Addended by: Sharee Pimple on: 01/24/2012 04:06 PM   Modules accepted: Orders

## 2012-01-24 NOTE — Progress Notes (Signed)
[facility] [place]   Patient name: [name] MRN: [id number] DOB: 06-10-1956 Sex: female     Chief Complaint  Patient presents with  . PVD    3 month f/u angio left fem pop -  pt states that she think she had a stroke on 01/12/2012    HISTORY OF PRESENT ILLNESS: The patient is back today for followup. She originally underwent left femoral to below-knee popliteal bypass graft with vein. Most recently she was found to have a velocity elevation her distal anastomosis. On 10/26/2011 she went in for angiography. This confirmed the above findings. She had balloon angioplasty of the distal anastomotic stenosis with good results. She is back today for followup. She has no complaints.  The patient does states that she had TIA-like symptoms about 2 weeks ago this consisted of left arm and leg weakness with left facial numbness. She did not seek medical attention but has notified her primary care doctor.  Past Medical History  Diagnosis Date  . PVD (peripheral vascular disease)   . Breast cancer     right breast, chemo and mastectomy  . CAD (coronary artery disease)     severe  . Tobacco abuse   . Diabetes mellitus   . Hyperlipidemia   . Stroke     Past Surgical History  Procedure Date  . Mastectomy   . Below knee leg amputation     right  . Femoral-popliteal bypass graft   . Tubal ligation   . Cholecystectomy   . Hernia repair   . Pr vein bypass graft,aorto-fem-pop   . Carotid endarterectomy 08/10/2007    History   Social History  . Marital Status: Married    Spouse Name: N/A    Number of Children: N/A  . Years of Education: N/A   Occupational History  . Not on file.   Social History Main Topics  . Smoking status: Current Everyday Smoker -- 0.8 packs/day    Types: Cigarettes    Last Attempt to Quit: 08/01/2007  . Smokeless tobacco: Never Used  . Alcohol Use: No  . Drug Use: No  . Sexually Active: Not on file   Other Topics Concern  . Not  on file   Social History Narrative  . No narrative on file    Family History  Problem Relation Age of Onset  . Lung cancer Mother   . Heart disease Father   . Heart attack Maternal Grandfather     Allergies as of 01/24/2012 - Review Complete 01/24/2012  Allergen Reaction Noted  . Fenofibrate Shortness Of Breath   . Lidocaine Anaphylaxis 11/19/2010  . Procaine hcl (procaine hcl) Anaphylaxis 11/19/2010  . Atacand (candesartan cilexetil) Other (See Comments)   . Codeine Other (See Comments)   . Iohexol  07/31/2007  . Lyrica (pregabalin) Other (See Comments)   . Neurontin (gabapentin) Other (See Comments)   . Other    . Ramipril Other (See Comments)   . Soap    . Vitamin d analogs    . Lodine (etodolac) Rash   . Tetracyclines & related Rash     Current Outpatient Prescriptions on File Prior to Visit  Medication Sig Dispense Refill  . ALPRAZolam (XANAX) 0.5 MG tablet Take 0.5 mg by mouth at bedtime as needed. For anxiety      . APIDRA SOLOSTAR 100 UNIT/ML injection Inject 4-16 Units into the skin See admin instructions. DAILY SLIDING SCALE based on cbg readings      .  aspirin EC 81 MG tablet Take 81 mg by mouth daily.      Marland Kitchen atenolol (TENORMIN) 25 MG tablet Take 1 tablet (25 mg total) by mouth daily.  30 tablet  12  . B-D INS SYR ULTRAFINE 1CC/31G 31G X 5/16" 1 ML MISC       . B-D ULTRAFINE III SHORT PEN 31G X 8 MM MISC       . insulin NPH-insulin regular (HUMULIN 70/30) (70-30) 100 UNIT/ML injection Inject 55-65 Units into the skin 2 (two) times daily with a meal. 55 units in the morning 65 units at bedtime      . ketotifen (ALAWAY) 0.025 % ophthalmic solution Place 1 drop into both eyes 2 (two) times daily as needed. For allergies      . Loratadine-Pseudoephedrine (ALLERGY RELIEF D PO) Take 1 tablet by mouth daily.       . nitroGLYCERIN (NITROSTAT) 0.4 MG SL tablet Place 0.4 mg under the tongue every 5 (five) minutes as needed. For chest pain      . clopidogrel (PLAVIX) 75 MG  tablet Take 75 mg by mouth daily.         REVIEW OF SYSTEMS: Please see history of present illness regarding her neurologic symptoms, otherwise no changes  PHYSICAL EXAMINATION:   Vital signs are BP 125/79  Pulse 82  Resp 16  Ht 5\' 9"  (1.753 m)  Wt 173 lb (78.472 kg)  BMI 25.55 kg/m2  SpO2 97% General: The patient appears their stated age. HEENT:  No gross abnormalities Pulmonary:  Non labored breathing Musculoskeletal: Right leg is surgically absent Neurologic: No focal weakness or paresthesias are detected, Skin: There are no ulcer or rashes noted. Psychiatric: The patient has normal affect. Cardiovascular: There is a regular rate and rhythm without significant murmur appreciated. Pedal pulses are not palpable.   Diagnostic Studies Ultrasound was ordered and reviewed today. ABI on the left is 0.48. There are velocity elevations at the proximal and distal anastomosis proximally is 4 34 cm/s (this is decreased from 3 months ago where was 496). The distal anastomosis is 356  Assessment: Status post left femoral-popliteal bypass graft for ulceration. Plan: I told the patient is a very concerned about her bypass however since we recently intervened on this 3 months ago I am recommending waiting another 3 months to repeat the ultrasound before intervening. Should I need to go back at would have to come from the left arm as the groin stenosis is not accessible to to her aortobifemoral bypass graft. I would also entertain atherectomy should be devices be a to reach the below knee anastomosis. I also told the patient that she does not have open wounds at this time and so if the bypass graft occludes, she probably will not even note the difference. I've also again stressed the importance of smoking cessation given her vascular disease.  I did ask her about her strokelike symptoms. They have resolved. I reviewed her carotid ultrasound from Dr.Nishan's office which was done over a year ago.  This shows minimal right carotid stenosis and 60-79% left carotid stenosis. Since her symptoms were left-sided I did not think that the carotid artery is the etiology. She is scheduled to have these repeated in September. She has notified her primary care physician regarding her strokelike symptoms  The patient will come back to see me in 3 months  V. Charlena Cross, M.D. [facility] [place] Office: [phone] Pager:  [phone]

## 2012-03-08 ENCOUNTER — Encounter (INDEPENDENT_AMBULATORY_CARE_PROVIDER_SITE_OTHER): Payer: Medicare Other

## 2012-03-08 ENCOUNTER — Encounter: Payer: Self-pay | Admitting: Cardiovascular Disease

## 2012-03-08 ENCOUNTER — Ambulatory Visit (INDEPENDENT_AMBULATORY_CARE_PROVIDER_SITE_OTHER): Payer: Medicare Other | Admitting: Cardiovascular Disease

## 2012-03-08 VITALS — BP 130/58 | HR 89 | Ht 69.0 in | Wt 175.4 lb

## 2012-03-08 DIAGNOSIS — Z01810 Encounter for preprocedural cardiovascular examination: Secondary | ICD-10-CM

## 2012-03-08 DIAGNOSIS — G459 Transient cerebral ischemic attack, unspecified: Secondary | ICD-10-CM

## 2012-03-08 DIAGNOSIS — I739 Peripheral vascular disease, unspecified: Secondary | ICD-10-CM

## 2012-03-08 DIAGNOSIS — F17200 Nicotine dependence, unspecified, uncomplicated: Secondary | ICD-10-CM

## 2012-03-08 DIAGNOSIS — I2510 Atherosclerotic heart disease of native coronary artery without angina pectoris: Secondary | ICD-10-CM

## 2012-03-08 DIAGNOSIS — I6529 Occlusion and stenosis of unspecified carotid artery: Secondary | ICD-10-CM

## 2012-03-08 NOTE — Progress Notes (Signed)
Patient ID: [name], female   DOB: 04-21-1956, 56 y.o.   MRN: [id number] Complicated 56 yo vasculopath who was D/C from Dr Hazle Coca practice at [facility] for unknown reasons. 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 Moderate LAD and circumflex disease. 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 Fem pop bypass on left by Dr Myra Gianotti in July 2012 complicated by calf absecess    Still with some inflow issues with PCI in May.   Limited mobility but no angina. Describes "TIA;s with numbness on left side of face. No focal deficits. Compliant with ASA Unable to take plavix due to myalgias . No recent cardiac or vascular studies since 2009. Long discussion about smoking and fact that she will fail her fempop and require amputation. She does not appear to be motivated to quit. Denies palpiations, mild chronic dypnea. No syncope, diaphoresis. She has had breast CA   Reviewed nuclear from June 2012  minimal inferior wall thinning no frank infarct or ischemia EF 61%  Reviewed carotid and 60-79% left ICA needs F/U duplex is overdue  ROS: Denies fever, malais, weight loss, blurry vision, decreased visual acuity, cough, sputum, SOB, hemoptysis, pleuritic pain, palpitaitons, heartburn, abdominal pain, melena, lower extremity edema, claudication, or rash.  All other systems reviewed and negative  General: Affect appropriate Chronically ill female HEENT: normal Neck supple with no adenopathy JVP normal no bruits no thyromegaly Lungs clear with no wheezing and good diaphragmatic motion Heart:  S1/S2 no murmur, no rub, gallop or click PMI normal Abdomen: benighn, BS positve, no tenderness, no AAA Femoral bruit.  No HSM or HJR R BKA and left fem pop with calf scar No edema Neuro non-focal Skin warm and dry No muscular weakness   Current Outpatient Prescriptions  Medication Sig Dispense Refill  . ALPRAZolam (XANAX) 0.5 MG tablet Take 0.5 mg by mouth at  bedtime as needed. For anxiety      . APIDRA SOLOSTAR 100 UNIT/ML injection Inject 4-16 Units into the skin See admin instructions. DAILY SLIDING SCALE based on cbg readings      . aspirin EC 81 MG tablet Take 81 mg by mouth daily.      Marland Kitchen atenolol (TENORMIN) 25 MG tablet Take 1 tablet (25 mg total) by mouth daily.  30 tablet  12  . B-D INS SYR ULTRAFINE 1CC/31G 31G X 5/16" 1 ML MISC       . B-D ULTRAFINE III SHORT PEN 31G X 8 MM MISC       . insulin NPH-insulin regular (HUMULIN 70/30) (70-30) 100 UNIT/ML injection Inject 55-65 Units into the skin 2 (two) times daily with a meal. 55 units in the morning 65 units at bedtime      . ketotifen (ALAWAY) 0.025 % ophthalmic solution Place 1 drop into both eyes 2 (two) times daily as needed. For allergies      . Loratadine-Pseudoephedrine (ALLERGY RELIEF D PO) Take 1 tablet by mouth daily.       . nitroGLYCERIN (NITROSTAT) 0.4 MG SL tablet Place 0.4 mg under the tongue every 5 (five) minutes as needed. For chest pain        Allergies  Fenofibrate; Lidocaine; Procaine hcl; Atacand; Codeine; Iohexol; Lyrica; Neurontin; Other; Ramipril; Soap; Vitamin d analogs; Lodine; and Tetracyclines & related  Electrocardiogram:  10/27/11  SR rate 64 normal  Assessment and Plan

## 2012-03-08 NOTE — Patient Instructions (Signed)
Your physician wants you to follow-up in:   6 MONTHS WITH DR Haywood Filler will receive a reminder letter in the mail two months in advance. If you don't receive a letter, please call our office to schedule the follow-up appointment. Your physician recommends that you continue on your current medications as directed. Please refer to the Current Medication list given to you today. Your physician has requested that you have a carotid duplex. This test is an ultrasound of the carotid arteries in your neck. It looks at blood flow through these arteries that supply the brain with blood. Allow one hour for this exam. There are no restrictions or special instructions. DX TIA  NEEDS ASAP

## 2012-03-08 NOTE — Assessment & Plan Note (Signed)
Stable with no angina and good activity level.  Continue medical Rx  

## 2012-03-08 NOTE — Assessment & Plan Note (Signed)
Intolerant to Plavix regular and generic  Cannot afford Effient.  F/U carotid duplex  Has not had critical disease before

## 2012-03-08 NOTE — Assessment & Plan Note (Signed)
No angina  Low risk myovue 2012  Clear to have eye surgery in [place].

## 2012-03-08 NOTE — Assessment & Plan Note (Signed)
Counseled for less than 10 minuts No motivation to quit despite limb loss and ? Recent TIA

## 2012-03-08 NOTE — Assessment & Plan Note (Signed)
F/U Dr Myra Gianotti  Discussed risk of limb loss with continued smoking.  Stable claudication

## 2012-04-14 HISTORY — PX: CATARACT EXTRACTION: SUR2

## 2012-05-01 ENCOUNTER — Ambulatory Visit: Payer: Medicare Other | Admitting: Surgery

## 2012-06-16 ENCOUNTER — Other Ambulatory Visit: Payer: Self-pay | Admitting: *Deleted

## 2012-06-19 ENCOUNTER — Other Ambulatory Visit: Payer: Self-pay | Admitting: *Deleted

## 2012-06-19 DIAGNOSIS — N63 Unspecified lump in unspecified breast: Secondary | ICD-10-CM

## 2012-06-21 ENCOUNTER — Ambulatory Visit
Admission: RE | Admit: 2012-06-21 | Discharge: 2012-06-21 | Disposition: A | Discharge status: Home / Self Care | Payer: Medicare Other | Source: Ambulatory Visit | Attending: *Deleted | Admitting: *Deleted

## 2012-06-21 ENCOUNTER — Other Ambulatory Visit: Payer: Self-pay | Admitting: *Deleted

## 2012-06-21 DIAGNOSIS — N63 Unspecified lump in unspecified breast: Secondary | ICD-10-CM

## 2012-06-30 ENCOUNTER — Encounter: Payer: Self-pay | Admitting: Surgery

## 2012-07-03 ENCOUNTER — Encounter (INDEPENDENT_AMBULATORY_CARE_PROVIDER_SITE_OTHER): Payer: Medicare Other | Admitting: *Deleted

## 2012-07-03 ENCOUNTER — Ambulatory Visit (INDEPENDENT_AMBULATORY_CARE_PROVIDER_SITE_OTHER): Payer: Medicare Other | Admitting: Surgery

## 2012-07-03 ENCOUNTER — Encounter: Payer: Self-pay | Admitting: Surgery

## 2012-07-03 VITALS — BP 129/55 | HR 75 | Ht 69.0 in | Wt 180.0 lb

## 2012-07-03 DIAGNOSIS — I70219 Atherosclerosis of native arteries of extremities with intermittent claudication, unspecified extremity: Secondary | ICD-10-CM

## 2012-07-03 DIAGNOSIS — Z48812 Encounter for surgical aftercare following surgery on the circulatory system: Secondary | ICD-10-CM

## 2012-07-03 NOTE — Addendum Note (Signed)
Addended by: Sharee Pimple on: 07/03/2012 02:34 PM   Modules accepted: Orders

## 2012-07-03 NOTE — Progress Notes (Signed)
[facility] [place]   Patient name: [name] MRN: [id number] DOB: 11-13-1955 Sex: female     Chief Complaint  Patient presents with  . PVD    3 month f/u pt c/o of numbness/tingling/swelling in L arm     HISTORY OF PRESENT ILLNESS: The patient is back today for followup. She is status post left femoral to below knee popliteal artery bypass graft with vein on 12/03/2010. She has a history of iliac stenting in the left common and external iliac on 09/08/2010. She has also undergone angioplasty of the distal anastomosis on 10/26/2011. She requires antegrade access. She has disease proximal to the proximal anastomosis, however this is difficult to get to angiographically given her left subclavian disease. The patient has been doing well since I last saw her. She continues to have complaints about her left arm. She had what sounds like a TIA/stroke several months ago. Carotid ultrasound shows minimal right carotid stenosis. She denies having ulcers or claudication symptoms in the left leg. She has a prosthesis on the right leg.  Past Medical History  Diagnosis Date  . PVD (peripheral vascular disease)   . Breast cancer     right breast, chemo and mastectomy  . CAD (coronary artery disease)     severe  . Tobacco abuse   . Diabetes mellitus   . Hyperlipidemia   . Stroke     Past Surgical History  Procedure Date  . Mastectomy   . Below knee leg amputation     right  . Femoral-popliteal bypass graft   . Tubal ligation   . Cholecystectomy   . Hernia repair   . Pr vein bypass graft,aorto-fem-pop   . Carotid endarterectomy 08/10/2007  . Cataract extraction 04/2012    Left eye    History   Social History  . Marital Status: Married    Spouse Name: N/A    Number of Children: N/A  . Years of Education: N/A   Occupational History  . Not on file.   Social History Main Topics  . Smoking status: Current Every Day Smoker -- 0.8 packs/day    Types:  Cigarettes    Last Attempt to Quit: 08/01/2007  . Smokeless tobacco: Never Used  . Alcohol Use: No  . Drug Use: No  . Sexually Active: Not on file   Other Topics Concern  . Not on file   Social History Narrative  . No narrative on file    Family History  Problem Relation Age of Onset  . Lung cancer Mother   . Heart disease Father   . Heart attack Maternal Grandfather     Allergies as of 07/03/2012 - Review Complete 07/03/2012  Allergen Reaction Noted  . Fenofibrate Shortness Of Breath   . Lidocaine Anaphylaxis 11/19/2010  . Procaine hcl (procaine hcl) Anaphylaxis 11/19/2010  . Atacand (candesartan cilexetil) Other (See Comments)   . Codeine Other (See Comments)   . Iohexol  07/31/2007  . Lyrica (pregabalin) Other (See Comments)   . Neurontin (gabapentin) Other (See Comments)   . Other    . Ramipril Other (See Comments)   . Soap    . Vitamin d analogs    . Lodine (etodolac) Rash   . Tetracyclines & related Rash     Current Outpatient Prescriptions on File Prior to Visit  Medication Sig Dispense Refill  . APIDRA SOLOSTAR 100 UNIT/ML injection Inject 4-16 Units into the skin See admin instructions. DAILY SLIDING SCALE based on cbg  readings      . aspirin EC 81 MG tablet Take 81 mg by mouth daily.      . B-D INS SYR ULTRAFINE 1CC/31G 31G X 5/16" 1 ML MISC       . B-D ULTRAFINE III SHORT PEN 31G X 8 MM MISC       . insulin NPH-insulin regular (HUMULIN 70/30) (70-30) 100 UNIT/ML injection Inject 55-65 Units into the skin 2 (two) times daily with a meal. 55 units in the morning 65 units at bedtime      . ketotifen (ALAWAY) 0.025 % ophthalmic solution Place 1 drop into both eyes 2 (two) times daily as needed. For allergies      . Loratadine-Pseudoephedrine (ALLERGY RELIEF D PO) Take 1 tablet by mouth daily.       . nitroGLYCERIN (NITROSTAT) 0.4 MG SL tablet Place 0.4 mg under the tongue every 5 (five) minutes as needed. For chest pain      . ALPRAZolam (XANAX) 0.5 MG tablet  Take 0.5 mg by mouth at bedtime as needed. For anxiety      . atenolol (TENORMIN) 25 MG tablet Take 1 tablet (25 mg total) by mouth daily.  30 tablet  12     REVIEW OF SYSTEMS: No changes from prior visit  PHYSICAL EXAMINATION:   Vital signs are BP 129/55  Pulse 75  Ht 5\' 9"  (1.753 m)  Wt 180 lb (81.647 kg)  BMI 26.58 kg/m2  SpO2 100% General: The patient appears their stated age. HEENT:  No gross abnormalities Pulmonary:  Non labored breathing Abdomen: Soft and non-tender Musculoskeletal: Left leg is surgically absent Neurologic: No focal weakness or paresthesias are detected, Skin: There are no ulcer or rashes noted. Psychiatric: The patient has normal affect. Cardiovascular: There is a regular rate and rhythm without significant murmur appreciated. Pedal pulses are not palpable. Left radial and brachial pulses are not palpable. She has a palpable right radial and right brachial pulse.   Diagnostic Studies Duplex ultrasound today shows an ABI of 0.49 on the left. There was a 0.48 and August of 2013. There are elevated velocities at the proximal and distal anastomosis. Proximally it measures 333 cm/s. Distally it measures 325 cm/s. Previously, the distal anastomosis was 356 and the proximal anastomosis was 434  Assessment: Status post left leg bypass Plan: In order to address the stenosis proximal to her bypass graft, the patient will require right brachial access. Her velocity profile seemed stable in this area from her previous imaging study. Since her previous imaging studies she has also had this evaluated angiographically. The stenosis is over the femoral head. It could be treated percutaneously however this is an area which is not ideally treated endovascularly. Because she remained symptom-free, and because her velocity profile and ankle-brachial indices are not significantly changed, I have recommended repeat her scanning in 3 months. I have encouraged her to continue with  the effort to stop smoking as I think this is having a significant impact on her bypass graft. She will see me back in 3 months  V. Charlena Cross, M.D. [facility] [place] Office: [phone] Pager:  [phone]

## 2012-07-06 ENCOUNTER — Other Ambulatory Visit: Payer: Self-pay | Admitting: *Deleted

## 2012-07-06 MED ORDER — ATENOLOL 25 MG PO TABS: 25.0000 mg | ORAL_TABLET | Freq: Every day | ORAL | Status: AC

## 2012-10-09 ENCOUNTER — Ambulatory Visit: Payer: Medicare Other | Admitting: Surgery

## 2012-11-13 ENCOUNTER — Ambulatory Visit: Payer: Medicare Other | Admitting: Surgery

## 2013-01-08 ENCOUNTER — Ambulatory Visit: Payer: Medicare Other | Admitting: Surgery

## 2013-02-16 ENCOUNTER — Encounter: Payer: Self-pay | Admitting: Surgery

## 2013-02-19 ENCOUNTER — Encounter (INDEPENDENT_AMBULATORY_CARE_PROVIDER_SITE_OTHER): Payer: Medicare Other | Admitting: *Deleted

## 2013-02-19 ENCOUNTER — Ambulatory Visit (INDEPENDENT_AMBULATORY_CARE_PROVIDER_SITE_OTHER): Payer: Medicare Other | Admitting: Surgery

## 2013-02-19 ENCOUNTER — Encounter: Payer: Self-pay | Admitting: Surgery

## 2013-02-19 VITALS — BP 108/60 | HR 68 | Ht 69.0 in | Wt 176.4 lb

## 2013-02-19 DIAGNOSIS — Z48812 Encounter for surgical aftercare following surgery on the circulatory system: Secondary | ICD-10-CM

## 2013-02-19 DIAGNOSIS — I739 Peripheral vascular disease, unspecified: Secondary | ICD-10-CM

## 2013-02-19 DIAGNOSIS — I70219 Atherosclerosis of native arteries of extremities with intermittent claudication, unspecified extremity: Secondary | ICD-10-CM

## 2013-02-19 NOTE — Progress Notes (Signed)
[facility] [place]   Patient name: [name] MRN: [id number] DOB: 01-05-1956 Sex: female     Chief Complaint  Patient presents with  . Re-evaluation    3 month f/u check area of stenosis of BPG    HISTORY OF PRESENT ILLNESS: The patient is back today for followup. She is status post left femoral to below knee popliteal artery bypass graft with vein on 12/03/2010. She has a history of iliac stenting in the left common and external iliac on 09/08/2010. She has also undergone angioplasty of the distal anastomosis on 10/26/2011. She requires antegrade access. She has disease proximal to the proximal anastomosis, however this is difficult to get to angiographically given her left subclavian disease. The patient has been doing well since I last saw her. She continues to have complaints about her left arm. She had what sounds like a    Past Medical History  Diagnosis Date  . PVD (peripheral vascular disease)   . Breast cancer     right breast, chemo and mastectomy  . CAD (coronary artery disease)     severe  . Tobacco abuse   . Diabetes mellitus   . Hyperlipidemia   . Stroke     Past Surgical History  Procedure Laterality Date  . Mastectomy    . Below knee leg amputation      right  . Femoral-popliteal bypass graft    . Tubal ligation    . Cholecystectomy    . Hernia repair    . Pr vein bypass graft,aorto-fem-pop    . Carotid endarterectomy  08/10/2007  . Cataract extraction  04/2012    Left eye    History   Social History  . Marital Status: Married    Spouse Name: N/A    Number of Children: N/A  . Years of Education: N/A   Occupational History  . Not on file.   Social History Main Topics  . Smoking status: Current Every Day Smoker -- 1.00 packs/day    Types: Cigarettes    Last Attempt to Quit: 08/01/2007  . Smokeless tobacco: Never Used  . Alcohol Use: No  . Drug Use: No  . Sexual Activity: Not on file   Other Topics Concern  . Not  on file   Social History Narrative  . No narrative on file    Family History  Problem Relation Age of Onset  . Lung cancer Mother   . Heart disease Father   . Heart attack Maternal Grandfather     Allergies as of 02/19/2013 - Review Complete 02/19/2013  Allergen Reaction Noted  . Fenofibrate Shortness Of Breath   . Lidocaine Anaphylaxis 11/19/2010  . Procaine hcl [procaine hcl] Anaphylaxis 11/19/2010  . Atacand [candesartan cilexetil] Other (See Comments)   . Codeine Other (See Comments)   . Iohexol  07/31/2007  . Lyrica [pregabalin] Other (See Comments)   . Neurontin [gabapentin] Other (See Comments)   . Other    . Ramipril Other (See Comments)   . Soap    . Vitamin d analogs    . Lodine [etodolac] Rash   . Tetracyclines & related Rash     Current Outpatient Prescriptions on File Prior to Visit  Medication Sig Dispense Refill  . ALPRAZolam (XANAX) 0.25 MG tablet Take 1 tablet by mouth daily.      Marland Kitchen ALPRAZolam (XANAX) 0.5 MG tablet Take 0.5 mg by mouth at bedtime as needed. For anxiety      . APIDRA  SOLOSTAR 100 UNIT/ML injection Inject 4-16 Units into the skin See admin instructions. DAILY SLIDING SCALE based on cbg readings      . aspirin EC 81 MG tablet Take 81 mg by mouth daily.      Marland Kitchen atenolol (TENORMIN) 25 MG tablet Take 1 tablet (25 mg total) by mouth daily.  30 tablet  12  . B-D INS SYR ULTRAFINE 1CC/31G 31G X 5/16" 1 ML MISC       . B-D ULTRAFINE III SHORT PEN 31G X 8 MM MISC       . insulin NPH-insulin regular (HUMULIN 70/30) (70-30) 100 UNIT/ML injection Inject 55-65 Units into the skin 2 (two) times daily with a meal. 55 units in the morning 65 units at bedtime      . ketotifen (ALAWAY) 0.025 % ophthalmic solution Place 1 drop into both eyes 2 (two) times daily as needed. For allergies      . Loratadine-Pseudoephedrine (ALLERGY RELIEF D PO) Take 1 tablet by mouth daily.       . nitroGLYCERIN (NITROSTAT) 0.4 MG SL tablet Place 0.4 mg under the tongue every 5  (five) minutes as needed. For chest pain       No current facility-administered medications on file prior to visit.     REVIEW OF SYSTEMS: No changes from prior visit  PHYSICAL EXAMINATION:   Vital signs are BP 108/60  Pulse 68  Ht 5\' 9"  (1.753 m)  Wt 176 lb 6.4 oz (80.015 kg)  BMI 26.04 kg/m2  SpO2 100% General: The patient appears their stated age. HEENT:  No gross abnormalities Pulmonary:  Non labored breathing Musculoskeletal: Right leg is surgically absent Neurologic: No focal weakness or paresthesias are detected, Skin: There are no ulcer or rashes noted. Psychiatric: The patient has normal affect. Cardiovascular: There is a regular rate and rhythm without significant murmur appreciated. Palpable left brachial pulse, I cannot palpate her left radial to   Diagnostic Studies Duplex ultrasound was ordered and reviewed today. Velocity profile is 325 to the common femoral artery and 356 at the proximal bypass graft. Distally peak velocity is 247. Her ABI remains 0.45.  Assessment: Status post left femoral-popliteal bypass graft for limb salvage Plan: The patient is artery undergone angioplasty of a stenosis within her distal anastomosis. This required antegrade access. She now has progressive stenosis within the proximal portion of her bypass graft. Accessed to this area will require a left brachial puncture. I have elected to continue to observe this area and have her be imaged in 3 months. Her velocity profile has increased again I will schedule her for angiogram.  Jorge Ny, M.D. [facility] [place] Office: [phone] Pager:  [phone]

## 2013-02-21 ENCOUNTER — Other Ambulatory Visit: Payer: Self-pay | Admitting: *Deleted

## 2013-02-21 DIAGNOSIS — Z48812 Encounter for surgical aftercare following surgery on the circulatory system: Secondary | ICD-10-CM

## 2013-02-21 DIAGNOSIS — I739 Peripheral vascular disease, unspecified: Secondary | ICD-10-CM

## 2013-05-25 ENCOUNTER — Encounter: Payer: Self-pay | Admitting: Surgery

## 2013-05-28 ENCOUNTER — Encounter: Payer: Self-pay | Admitting: Surgery

## 2013-05-28 ENCOUNTER — Ambulatory Visit (INDEPENDENT_AMBULATORY_CARE_PROVIDER_SITE_OTHER): Payer: Medicare Other | Admitting: Surgery

## 2013-05-28 ENCOUNTER — Other Ambulatory Visit: Payer: Self-pay | Admitting: Surgery

## 2013-05-28 ENCOUNTER — Encounter (HOSPITAL_COMMUNITY): Payer: Medicare Other

## 2013-05-28 ENCOUNTER — Ambulatory Visit (INDEPENDENT_AMBULATORY_CARE_PROVIDER_SITE_OTHER)
Admission: RE | Admit: 2013-05-28 | Discharge: 2013-05-28 | Disposition: A | Discharge status: Home / Self Care | Payer: Medicare Other | Source: Ambulatory Visit | Attending: Surgery | Admitting: Surgery

## 2013-05-28 ENCOUNTER — Ambulatory Visit (HOSPITAL_COMMUNITY)
Admission: RE | Admit: 2013-05-28 | Discharge: 2013-05-28 | Disposition: A | Discharge status: Home / Self Care | Payer: Medicare Other | Source: Ambulatory Visit | Attending: Surgery | Admitting: Surgery

## 2013-05-28 ENCOUNTER — Other Ambulatory Visit (HOSPITAL_COMMUNITY): Payer: Medicare Other

## 2013-05-28 VITALS — BP 135/49 | HR 64 | Ht 69.0 in | Wt 175.6 lb

## 2013-05-28 DIAGNOSIS — I739 Peripheral vascular disease, unspecified: Secondary | ICD-10-CM

## 2013-05-28 DIAGNOSIS — I6529 Occlusion and stenosis of unspecified carotid artery: Secondary | ICD-10-CM

## 2013-05-28 DIAGNOSIS — Z48812 Encounter for surgical aftercare following surgery on the circulatory system: Secondary | ICD-10-CM

## 2013-05-28 NOTE — Progress Notes (Signed)
Patient name: [name] MRN: [id number] DOB: January 02, 1956 Sex: female     Chief Complaint  Patient presents with  . Carotid    3 monthf /u   . PVD    HISTORY OF PRESENT ILLNESS: The patient is back today for followup. She is status post left femoral to below knee popliteal artery bypass graft with vein on 12/03/2010. She has a history of iliac stenting in the left common and external iliac on 09/08/2010. She has also undergone angioplasty of the distal anastomosis on 10/26/2011. She requires antegrade access. She has disease proximal to the proximal anastomosis.  I have been following this with ultrasound.  She is back today for followup.  She is finally quit smoking.  Her husband recently had a heart attack and they have both clipped together.   Past Medical History  Diagnosis Date  . PVD (peripheral vascular disease)   . Breast cancer     right breast, chemo and mastectomy  . CAD (coronary artery disease)     severe  . Tobacco abuse   . Diabetes mellitus   . Hyperlipidemia   . Stroke     Past Surgical History  Procedure Laterality Date  . Mastectomy    . Below knee leg amputation      right  . Femoral-popliteal bypass graft    . Tubal ligation    . Cholecystectomy    . Hernia repair    . Pr vein bypass graft,aorto-fem-pop    . Carotid endarterectomy  08/10/2007  . Cataract extraction  04/2012    Left eye    History   Social History  . Marital Status: Married    Spouse Name: N/A    Number of Children: N/A  . Years of Education: N/A   Occupational History  . Not on file.   Social History Main Topics  . Smoking status: Current Every Day Smoker -- 1.00 packs/day    Types: Cigarettes    Last Attempt to Quit: 08/01/2007  . Smokeless tobacco: Never Used  . Alcohol Use: No  . Drug Use: No  . Sexual Activity: Not on file   Other Topics Concern  . Not on file   Social History Narrative  . No narrative on file    Family History  Problem Relation Age  of Onset  . Lung cancer Mother   . Heart disease Father   . Heart attack Maternal Grandfather     Allergies as of 05/28/2013 - Review Complete 05/28/2013  Allergen Reaction Noted  . Fenofibrate Shortness Of Breath   . Lidocaine Anaphylaxis 11/19/2010  . Procaine hcl [procaine hcl] Anaphylaxis 11/19/2010  . Atacand [candesartan cilexetil] Other (See Comments)   . Codeine Other (See Comments)   . Iohexol  07/31/2007  . Lyrica [pregabalin] Other (See Comments)   . Neurontin [gabapentin] Other (See Comments)   . Other    . Ramipril Other (See Comments)   . Soap    . Vitamin d analogs    . Lodine [etodolac] Rash   . Tetracyclines & related Rash     Current Outpatient Prescriptions on File Prior to Visit  Medication Sig Dispense Refill  . ALPRAZolam (XANAX) 0.25 MG tablet Take 1 tablet by mouth daily.      Marland Kitchen ALPRAZolam (XANAX) 0.5 MG tablet Take 0.5 mg by mouth at bedtime as needed. For anxiety      . APIDRA SOLOSTAR 100 UNIT/ML injection Inject 4-16 Units into the skin See admin  instructions. DAILY SLIDING SCALE based on cbg readings      . aspirin EC 81 MG tablet Take 81 mg by mouth daily.      Marland Kitchen atenolol (TENORMIN) 25 MG tablet Take 1 tablet (25 mg total) by mouth daily.  30 tablet  12  . B-D INS SYR ULTRAFINE 1CC/31G 31G X 5/16" 1 ML MISC       . B-D ULTRAFINE III SHORT PEN 31G X 8 MM MISC       . insulin NPH-insulin regular (HUMULIN 70/30) (70-30) 100 UNIT/ML injection Inject 55-65 Units into the skin 2 (two) times daily with a meal. 55 units in the morning 65 units at bedtime      . ketotifen (ALAWAY) 0.025 % ophthalmic solution Place 1 drop into both eyes 2 (two) times daily as needed. For allergies      . Loratadine-Pseudoephedrine (ALLERGY RELIEF D PO) Take 1 tablet by mouth daily.       . nitroGLYCERIN (NITROSTAT) 0.4 MG SL tablet Place 0.4 mg under the tongue every 5 (five) minutes as needed. For chest pain       No current facility-administered medications on file prior  to visit.     REVIEW OF SYSTEMS: Please see history of present illness, otherwise no changes from prior visit  PHYSICAL EXAMINATION:   Vital signs are BP 135/49  Pulse 64  Ht 5\' 9"  (1.753 m)  Wt 175 lb 9.6 oz (79.652 kg)  BMI 25.92 kg/m2  SpO2 100% General: The patient appears their stated age. HEENT:  No gross abnormalities Pulmonary:  Non labored breathing Musculoskeletal: There are no major deformities. Neurologic: No focal weakness or paresthesias are detected, Skin: There are no ulcer or rashes noted. Psychiatric: The patient has normal affect.   Cardiovascular: Palpable left brachial pulse    Diagnostic Studies Duplex ultrasound today was ordered and reviewed. Carotid ultrasound: No significant carotid stenosis bilaterally Lower; ABI today is 0.5.  Previously a 0.45.  Velocity profile of the proximal anastomosis is 348 cm/s.  Previously this was 356  Assessment: Peripheral vascular disease, left leg Plan: There has been no significant change of the velocity profile at the proximal anastomosis on today's ultrasound compared to her prior ultrasound.  Therefore, I have elected to continue with ultrasound surveillance.  Next that he will be in 3 months.  If there has been an increase in the velocity profile at the proximal anastomosis, she would require angiography with a brachial approach.  Jorge Ny, M.D. [facility] [place] Office: [phone] Pager:  [phone]

## 2013-05-28 NOTE — Addendum Note (Signed)
Addended by: Sharee Pimple on: 05/28/2013 04:14 PM   Modules accepted: Orders

## 2013-08-27 ENCOUNTER — Ambulatory Visit: Payer: Medicare Other | Admitting: Surgery

## 2013-08-27 ENCOUNTER — Encounter (HOSPITAL_COMMUNITY): Payer: Medicare Other

## 2013-08-27 ENCOUNTER — Other Ambulatory Visit (HOSPITAL_COMMUNITY): Payer: Medicare Other

## 2013-09-21 ENCOUNTER — Encounter: Payer: Self-pay | Admitting: Surgery

## 2013-09-24 ENCOUNTER — Encounter: Payer: Self-pay | Admitting: Surgery

## 2013-09-24 ENCOUNTER — Ambulatory Visit (INDEPENDENT_AMBULATORY_CARE_PROVIDER_SITE_OTHER): Payer: Medicare Other | Admitting: Surgery

## 2013-09-24 ENCOUNTER — Ambulatory Visit (HOSPITAL_COMMUNITY)
Admission: RE | Admit: 2013-09-24 | Discharge: 2013-09-24 | Disposition: A | Discharge status: Home / Self Care | Payer: Medicare Other | Source: Ambulatory Visit | Attending: Surgery | Admitting: Surgery

## 2013-09-24 ENCOUNTER — Ambulatory Visit (INDEPENDENT_AMBULATORY_CARE_PROVIDER_SITE_OTHER)
Admission: RE | Admit: 2013-09-24 | Discharge: 2013-09-24 | Disposition: A | Discharge status: Home / Self Care | Payer: Medicare Other | Source: Ambulatory Visit | Attending: Surgery | Admitting: Surgery

## 2013-09-24 VITALS — BP 138/64 | HR 74 | Ht 69.0 in | Wt 177.0 lb

## 2013-09-24 DIAGNOSIS — I739 Peripheral vascular disease, unspecified: Secondary | ICD-10-CM | POA: Insufficient documentation

## 2013-09-24 NOTE — Progress Notes (Signed)
 Patient name: [name] MRN: [id number] DOB: 03/09/1956 Sex: female  Chief Complaint   Patient presents with   .  Re-evaluation     3 month f/u   HISTORY OF PRESENT ILLNESS:  The patient is back today for followup. She is status post left femoral to below knee popliteal artery bypass graft with vein on 12/03/2010. She has a history of iliac stenting in the left common and external iliac on 09/08/2010. She has also undergone angioplasty of the distal anastomosis on 10/26/2011. She unfortunately has started smoking again given some stressful life situations. She had quit for a while with her husband. She denies ulcers or pain in her leg.  Past Medical History   Diagnosis  Date   .  PVD (peripheral vascular disease)    .  Breast cancer      right breast, chemo and mastectomy   .  CAD (coronary artery disease)      severe   .  Tobacco abuse    .  Diabetes mellitus    .  Hyperlipidemia    .  Stroke     Past Surgical History   Procedure  Laterality  Date   .  Mastectomy     .  Below knee leg amputation       right   .  Femoral-popliteal bypass graft     .  Tubal ligation     .  Cholecystectomy     .  Hernia repair     .  Pr vein bypass graft,aorto-fem-pop     .  Carotid endarterectomy   08/10/2007   .  Cataract extraction   04/2012     Left eye    History    Social History   .  Marital Status:  Married     Spouse Name:  N/A     Number of Children:  N/A   .  Years of Education:  N/A    Occupational History   .  Not on file.    Social History Main Topics   .  Smoking status:  Current Every Day Smoker -- 1.00 packs/day     Types:  Cigarettes     Last Attempt to Quit:  08/01/2007   .  Smokeless tobacco:  Never Used   .  Alcohol Use:  No   .  Drug Use:  No   .  Sexual Activity:  Not on file    Other Topics  Concern   .  Not on file    Social History Narrative   .  No narrative on file    Family History   Problem  Relation  Age of Onset   .  Lung cancer  Mother     .  Heart disease  Father    .  Heart attack  Maternal Grandfather     Allergies as of 09/24/2013 - Review Complete 09/24/2013   Allergen  Reaction  Noted   .  Fenofibrate  Shortness Of Breath    .  Lidocaine  Anaphylaxis  11/19/2010   .  Procaine hcl [procaine hcl]  Anaphylaxis  11/19/2010   .  Atacand [candesartan cilexetil]  Other (See Comments)    .  Codeine  Other (See Comments)    .  Iohexol   07/31/2007   .  Lyrica [pregabalin]  Other (See Comments)    .  Neurontin [gabapentin]  Other (See Comments)    .  Other     .    Ramipril  Other (See Comments)    .  Soap     .  Vitamin d analogs     .  Lodine [etodolac]  Rash    .  Tetracyclines & related  Rash     Current Outpatient Prescriptions on File Prior to Visit   Medication  Sig  Dispense  Refill   .  ALPRAZolam (XANAX) 0.25 MG tablet  Take 1 tablet by mouth daily.     .  ALPRAZolam (XANAX) 0.5 MG tablet  Take 0.5 mg by mouth at bedtime as needed. For anxiety     .  APIDRA SOLOSTAR 100 UNIT/ML injection  Inject 4-16 Units into the skin See admin instructions. DAILY SLIDING SCALE based on cbg readings     .  aspirin EC 81 MG tablet  Take 81 mg by mouth daily.     .  B-D INS SYR ULTRAFINE 1CC/31G 31G X 5/16" 1 ML MISC      .  B-D ULTRAFINE III SHORT PEN 31G X 8 MM MISC      .  insulin NPH-insulin regular (HUMULIN 70/30) (70-30) 100 UNIT/ML injection  Inject 55-65 Units into the skin 2 (two) times daily with a meal. 55 units in the morning  65 units at bedtime     .  ketotifen (ALAWAY) 0.025 % ophthalmic solution  Place 1 drop into both eyes 2 (two) times daily as needed. For allergies     .  Loratadine-Pseudoephedrine (ALLERGY RELIEF D PO)  Take 1 tablet by mouth daily.     .  nitroGLYCERIN (NITROSTAT) 0.4 MG SL tablet  Place 0.4 mg under the tongue every 5 (five) minutes as needed. For chest pain     .  atenolol (TENORMIN) 25 MG tablet  Take 1 tablet (25 mg total) by mouth daily.  30 tablet  12    No current  facility-administered medications on file prior to visit.   REVIEW OF SYSTEMS:  No changes from prior visit  PHYSICAL EXAMINATION:  Vital signs are BP 138/64  Pulse 74  Ht 5' 9" (1.753 m)  Wt 177 lb (80.287 kg)  BMI 26.13 kg/m2  SpO2 100%  General: The patient appears their stated age.  HEENT: No gross abnormalities  Pulmonary: Non labored breathing  Musculoskeletal: Right leg amputation  Neurologic: No focal weakness or paresthesias are detected,  Skin: There are no ulcer or rashes noted.  Psychiatric: The patient has normal affect.  Cardiovascular: There is a regular rate and rhythm without significant murmur appreciated.  Diagnostic Studies  Vascular lab studies were ordered and reviewed she has had a change from triphasic to monophasic waveforms in her common femoral artery, suggesting more proximal disease. Her ABI is essentially stable at 0.5. Velocity profile has increased at the distal anastomosis, it is now 308 cm/s. Velocity profile in the groin is 352 in the common femoral artery.  Assessment:  Peripheral vascular disease  Plan:  There has been a change in the waveforms in the common femoral artery, suggesting proximal disease. In addition there has been progression of the velocity profile at the distal anastomosis. The patient does have a stenosis within the common femoral artery as well. I've been monitoring this for quite some time there does appear to have been progression of the disease since her last study. I think we are at the point where intervention is necessary. Because she has an occluded iliac system on the right, this will require a left brachial approach. I

## 2013-10-02 ENCOUNTER — Encounter (HOSPITAL_COMMUNITY): Payer: Self-pay | Admitting: Pharmacy Technician

## 2013-10-16 ENCOUNTER — Ambulatory Visit (HOSPITAL_COMMUNITY)
Admission: RE | Admit: 2013-10-16 | Discharge: 2013-10-16 | Disposition: A | Discharge status: Home / Self Care | Payer: Medicare Other | Source: Ambulatory Visit | Attending: Surgery | Admitting: Surgery

## 2013-10-16 ENCOUNTER — Encounter (HOSPITAL_COMMUNITY)
Admission: RE | Disposition: A | Discharge status: Home / Self Care | Payer: Self-pay | Source: Ambulatory Visit | Attending: Surgery

## 2013-10-16 ENCOUNTER — Other Ambulatory Visit: Payer: Self-pay | Admitting: *Deleted

## 2013-10-16 ENCOUNTER — Telehealth: Payer: Self-pay | Admitting: Surgery

## 2013-10-16 DIAGNOSIS — I70209 Unspecified atherosclerosis of native arteries of extremities, unspecified extremity: Secondary | ICD-10-CM | POA: Diagnosis present

## 2013-10-16 DIAGNOSIS — Z951 Presence of aortocoronary bypass graft: Secondary | ICD-10-CM | POA: Insufficient documentation

## 2013-10-16 DIAGNOSIS — Z91041 Radiographic dye allergy status: Secondary | ICD-10-CM | POA: Insufficient documentation

## 2013-10-16 DIAGNOSIS — I2510 Atherosclerotic heart disease of native coronary artery without angina pectoris: Secondary | ICD-10-CM | POA: Diagnosis not present

## 2013-10-16 DIAGNOSIS — Z8673 Personal history of transient ischemic attack (TIA), and cerebral infarction without residual deficits: Secondary | ICD-10-CM | POA: Diagnosis not present

## 2013-10-16 DIAGNOSIS — F17200 Nicotine dependence, unspecified, uncomplicated: Secondary | ICD-10-CM | POA: Insufficient documentation

## 2013-10-16 DIAGNOSIS — I739 Peripheral vascular disease, unspecified: Secondary | ICD-10-CM

## 2013-10-16 DIAGNOSIS — Z9010 Acquired absence of unspecified breast and nipple: Secondary | ICD-10-CM | POA: Insufficient documentation

## 2013-10-16 DIAGNOSIS — E119 Type 2 diabetes mellitus without complications: Secondary | ICD-10-CM | POA: Diagnosis not present

## 2013-10-16 DIAGNOSIS — Z888 Allergy status to other drugs, medicaments and biological substances status: Secondary | ICD-10-CM | POA: Insufficient documentation

## 2013-10-16 DIAGNOSIS — I70309 Unspecified atherosclerosis of unspecified type of bypass graft(s) of the extremities, unspecified extremity: Secondary | ICD-10-CM

## 2013-10-16 DIAGNOSIS — Z9889 Other specified postprocedural states: Secondary | ICD-10-CM | POA: Insufficient documentation

## 2013-10-16 DIAGNOSIS — F411 Generalized anxiety disorder: Secondary | ICD-10-CM | POA: Diagnosis not present

## 2013-10-16 DIAGNOSIS — Z794 Long term (current) use of insulin: Secondary | ICD-10-CM | POA: Diagnosis not present

## 2013-10-16 DIAGNOSIS — Z853 Personal history of malignant neoplasm of breast: Secondary | ICD-10-CM | POA: Insufficient documentation

## 2013-10-16 DIAGNOSIS — S88119A Complete traumatic amputation at level between knee and ankle, unspecified lower leg, initial encounter: Secondary | ICD-10-CM | POA: Diagnosis not present

## 2013-10-16 DIAGNOSIS — E785 Hyperlipidemia, unspecified: Secondary | ICD-10-CM | POA: Diagnosis not present

## 2013-10-16 DIAGNOSIS — Z48812 Encounter for surgical aftercare following surgery on the circulatory system: Secondary | ICD-10-CM

## 2013-10-16 LAB — POCT ACTIVATED CLOTTING TIME
ACTIVATED CLOTTING TIME: 166 s
Activated Clotting Time: 199 seconds

## 2013-10-16 LAB — GLUCOSE, CAPILLARY
GLUCOSE-CAPILLARY: 321 mg/dL — AB (ref 70–99)
Glucose-Capillary: 316 mg/dL — ABNORMAL HIGH (ref 70–99)

## 2013-10-16 LAB — POCT I-STAT, CHEM 8
BUN: 11 mg/dL (ref 6–23)
CALCIUM ION: 1.23 mmol/L (ref 1.12–1.23)
CREATININE: 0.6 mg/dL (ref 0.50–1.10)
Chloride: 102 mEq/L (ref 96–112)
GLUCOSE: 292 mg/dL — AB (ref 70–99)
HCT: 47 % — ABNORMAL HIGH (ref 36.0–46.0)
Hemoglobin: 16 g/dL — ABNORMAL HIGH (ref 12.0–15.0)
Potassium: 4 mEq/L (ref 3.7–5.3)
Sodium: 139 mEq/L (ref 137–147)
TCO2: 23 mmol/L (ref 0–100)

## 2013-10-16 SURGERY — ANGIOGRAM EXTREMITY BILATERAL

## 2013-10-16 MED ORDER — HYDRALAZINE HCL 20 MG/ML IJ SOLN: 10.0000 mg | INTRAMUSCULAR | Status: DC | PRN

## 2013-10-16 MED ORDER — HEPARIN SODIUM (PORCINE) 1000 UNIT/ML IJ SOLN
INTRAMUSCULAR | Status: AC
  Filled 2013-10-16: qty 1

## 2013-10-16 MED ORDER — INSULIN ASPART 100 UNIT/ML ~~LOC~~ SOLN
10.0000 [IU] | Freq: Once | SUBCUTANEOUS | Status: AC
  Administered 2013-10-16: 10 [IU] via SUBCUTANEOUS

## 2013-10-16 MED ORDER — INSULIN ASPART 100 UNIT/ML ~~LOC~~ SOLN
SUBCUTANEOUS | Status: AC
  Filled 2013-10-16: qty 1

## 2013-10-16 MED ORDER — FENTANYL CITRATE 0.05 MG/ML IJ SOLN: 25.0000 ug | INTRAMUSCULAR | Status: DC | PRN

## 2013-10-16 MED ORDER — METHYLPREDNISOLONE SODIUM SUCC 125 MG IJ SOLR
125.0000 mg | INTRAMUSCULAR | Status: AC
  Administered 2013-10-16: 125 mg via INTRAVENOUS
  Filled 2013-10-16: qty 2

## 2013-10-16 MED ORDER — METOPROLOL TARTRATE 1 MG/ML IV SOLN: 2.0000 mg | INTRAVENOUS | Status: DC | PRN

## 2013-10-16 MED ORDER — MIDAZOLAM HCL 2 MG/2ML IJ SOLN
INTRAMUSCULAR | Status: AC
  Filled 2013-10-16: qty 2

## 2013-10-16 MED ORDER — ACETAMINOPHEN 325 MG PO TABS: 325.0000 mg | ORAL_TABLET | ORAL | Status: DC | PRN

## 2013-10-16 MED ORDER — PHENOL 1.4 % MT LIQD: 1.0000 | OROMUCOSAL | Status: DC | PRN

## 2013-10-16 MED ORDER — GUAIFENESIN-DM 100-10 MG/5ML PO SYRP: 15.0000 mL | ORAL_SOLUTION | ORAL | Status: DC | PRN

## 2013-10-16 MED ORDER — SODIUM CHLORIDE 0.9 % IV SOLN: 1.0000 mL/kg/h | INTRAVENOUS | Status: DC

## 2013-10-16 MED ORDER — FAMOTIDINE IN NACL 20-0.9 MG/50ML-% IV SOLN
20.0000 mg | INTRAVENOUS | Status: AC
  Administered 2013-10-16: 20 mg via INTRAVENOUS
  Filled 2013-10-16: qty 50

## 2013-10-16 MED ORDER — ACETAMINOPHEN 325 MG RE SUPP: 325.0000 mg | RECTAL | Status: DC | PRN

## 2013-10-16 MED ORDER — ONDANSETRON HCL 4 MG/2ML IJ SOLN: 4.0000 mg | Freq: Four times a day (QID) | INTRAMUSCULAR | Status: DC | PRN

## 2013-10-16 MED ORDER — DIPHENHYDRAMINE HCL 50 MG/ML IJ SOLN
25.0000 mg | INTRAMUSCULAR | Status: AC
  Administered 2013-10-16: 25 mg via INTRAVENOUS
  Filled 2013-10-16: qty 1

## 2013-10-16 MED ORDER — SODIUM CHLORIDE 0.9 % IV SOLN
INTRAVENOUS | Status: DC
  Administered 2013-10-16: 07:00:00 via INTRAVENOUS

## 2013-10-16 MED ORDER — LIDOCAINE HCL (PF) 1 % IJ SOLN
INTRAMUSCULAR | Status: AC
Start: 2013-10-16 — End: 2013-10-16
  Filled 2013-10-16: qty 30

## 2013-10-16 MED ORDER — NITROGLYCERIN 0.2 MG/ML ON CALL CATH LAB
INTRAVENOUS | Status: AC
  Filled 2013-10-16: qty 1

## 2013-10-16 MED ORDER — LABETALOL HCL 5 MG/ML IV SOLN: 10.0000 mg | INTRAVENOUS | Status: DC | PRN

## 2013-10-16 MED ORDER — BUPIVACAINE HCL (PF) 0.25 % IJ SOLN
INTRAMUSCULAR | Status: AC
  Filled 2013-10-16: qty 30

## 2013-10-16 MED ORDER — FENTANYL CITRATE 0.05 MG/ML IJ SOLN
INTRAMUSCULAR | Status: AC
  Filled 2013-10-16: qty 2

## 2013-10-16 SURGICAL SUPPLY — 53 items
BANDAGE ELASTIC 4 VELCRO ST LF (GAUZE/BANDAGES/DRESSINGS) IMPLANT
BANDAGE ESMARK 6X9 LF (GAUZE/BANDAGES/DRESSINGS) IMPLANT
BNDG ESMARK 6X9 LF (GAUZE/BANDAGES/DRESSINGS)
CANISTER SUCTION 2500CC (MISCELLANEOUS) ×5 IMPLANT
CLIP TI MEDIUM 24 (CLIP) ×5 IMPLANT
CLIP TI WIDE RED SMALL 24 (CLIP) ×5 IMPLANT
COVER SURGICAL LIGHT HANDLE (MISCELLANEOUS) ×5 IMPLANT
CUFF TOURNIQUET SINGLE 24IN (TOURNIQUET CUFF) IMPLANT
CUFF TOURNIQUET SINGLE 34IN LL (TOURNIQUET CUFF) IMPLANT
CUFF TOURNIQUET SINGLE 44IN (TOURNIQUET CUFF) IMPLANT
DERMABOND ADVANCED (GAUZE/BANDAGES/DRESSINGS) ×2
DERMABOND ADVANCED .7 DNX12 (GAUZE/BANDAGES/DRESSINGS) ×3 IMPLANT
DRAIN CHANNEL 15F RND FF W/TCR (WOUND CARE) IMPLANT
DRAPE WARM FLUID 44X44 (DRAPE) ×5 IMPLANT
DRAPE X-RAY CASS 24X20 (DRAPES) IMPLANT
DRSG COVADERM 4X10 (GAUZE/BANDAGES/DRESSINGS) IMPLANT
DRSG COVADERM 4X8 (GAUZE/BANDAGES/DRESSINGS) IMPLANT
ELECT REM PT RETURN 9FT ADLT (ELECTROSURGICAL) ×5
ELECTRODE REM PT RTRN 9FT ADLT (ELECTROSURGICAL) ×3 IMPLANT
EVACUATOR SILICONE 100CC (DRAIN) IMPLANT
GLOVE BIOGEL PI IND STRL 7.5 (GLOVE) ×3 IMPLANT
GLOVE BIOGEL PI INDICATOR 7.5 (GLOVE) ×2
GLOVE SURG SS PI 7.5 STRL IVOR (GLOVE) ×5 IMPLANT
GOWN PREVENTION PLUS XXLARGE (GOWN DISPOSABLE) ×5 IMPLANT
GOWN STRL NON-REIN LRG LVL3 (GOWN DISPOSABLE) ×15 IMPLANT
HEMOSTAT SNOW SURGICEL 2X4 (HEMOSTASIS) IMPLANT
KIT BASIN OR (CUSTOM PROCEDURE TRAY) ×5 IMPLANT
KIT ROOM TURNOVER OR (KITS) ×5 IMPLANT
MARKER GRAFT CORONARY BYPASS (MISCELLANEOUS) IMPLANT
NS IRRIG 1000ML POUR BTL (IV SOLUTION) ×10 IMPLANT
PACK PERIPHERAL VASCULAR (CUSTOM PROCEDURE TRAY) ×5 IMPLANT
PAD ARMBOARD 7.5X6 YLW CONV (MISCELLANEOUS) ×10 IMPLANT
PADDING CAST COTTON 6X4 STRL (CAST SUPPLIES) IMPLANT
SET COLLECT BLD 21X3/4 12 (NEEDLE) IMPLANT
STOPCOCK 4 WAY LG BORE MALE ST (IV SETS) IMPLANT
SUT ETHILON 3 0 PS 1 (SUTURE) IMPLANT
SUT PROLENE 5 0 C 1 24 (SUTURE) ×5 IMPLANT
SUT PROLENE 6 0 BV (SUTURE) ×5 IMPLANT
SUT PROLENE 7 0 BV 1 (SUTURE) IMPLANT
SUT SILK 2 0 SH (SUTURE) ×5 IMPLANT
SUT SILK 3 0 (SUTURE)
SUT SILK 3-0 18XBRD TIE 12 (SUTURE) IMPLANT
SUT VIC AB 2-0 CT1 27 (SUTURE) ×8
SUT VIC AB 2-0 CT1 TAPERPNT 27 (SUTURE) ×6 IMPLANT
SUT VIC AB 3-0 SH 27 (SUTURE) ×4
SUT VIC AB 3-0 SH 27X BRD (SUTURE) ×6 IMPLANT
SUT VICRYL 4-0 PS2 18IN ABS (SUTURE) ×10 IMPLANT
TOWEL OR 17X24 6PK STRL BLUE (TOWEL DISPOSABLE) ×10 IMPLANT
TOWEL OR 17X26 10 PK STRL BLUE (TOWEL DISPOSABLE) ×10 IMPLANT
TRAY FOLEY CATH 16FRSI W/METER (SET/KITS/TRAYS/PACK) ×5 IMPLANT
TUBING EXTENTION W/L.L. (IV SETS) IMPLANT
UNDERPAD 30X30 INCONTINENT (UNDERPADS AND DIAPERS) ×5 IMPLANT
WATER STERILE IRR 1000ML POUR (IV SOLUTION) ×5 IMPLANT

## 2013-10-16 NOTE — Progress Notes (Signed)
Assumed care of patient from Virl Son, RN. Assessment documented.

## 2013-10-16 NOTE — Op Note (Signed)
Patient name: [name] MRN: [id number] DOB: [birth year] Sex: female  10/16/2013 Pre-operative Diagnosis: Bypass graft stenosis, left leg Post-operative diagnosis:  Same Surgeon:  Serafina Mitchell Procedure Performed:  1.  ultrasound-guided access, left brachial artery  2.  abdominal aortogram with bifemoral runoff  3.  angioplasty, left external iliac artery  4.  angioplasty, left common femoral artery  5.  angioplasty, left popliteal artery/distal bypass graft anastomosis    Indications:  The patient is previously undergone left femoral to below knee popliteal artery bypass graft for limb salvage.  She is also had percutaneous stenting from her aorta down to her left common femoral artery.  She has an occluded right external iliac artery.  Ultrasound surveillance indicated a inflow problem.  She is previously undergone angioplasty of her distal anastomosis.  She comes in today for evaluation of her inflow via a brachial approach.  She also has known distal anastomotic lesion as well.  Reportedly she has a prednisone allergy in addition to contrast allergy.  She did get Solu-Medrol this morning without any adverse affect.  Procedure:  The patient was identified in the holding area and taken to room 8.  The patient was then placed supine on the table and prepped and draped in the usual sterile fashion.  A time out was called.  Ultrasound was used to evaluate the left brachial artery.  It was patent .  A digital ultrasound image was acquired.  A micropuncture needle was used to access the left brachial artery under ultrasound guidance.  An 018 wire was advanced without resistance and a micropuncture sheath was placed.  The 018 wire was removed and a benson wire was placed.  The micropuncture sheath was exchanged for a 5 french sheath.  Nitroglycerin and heparin were administered through the sheath.  A pigtail catheter was then advanced down the descending aorta and placed in the distal abdominal  aorta.  An abdominal aortogram with bilateral runoff was then obtained.  Next, a KM P. catheter was advanced into the left external iliac artery and the distal images of the left leg were performed.   Findings:   Aortogram:  The distal abdominal aorta is patent.  There is a stent in the distal portion which goes into the left common iliac and external iliac artery.  At the distal end of the stent within the left external iliac artery there is a high grade, greater than 80% stenosis.  Right Lower Extremity:  The right common iliac artery is diseased but patent the right external iliac artery is occluded.  Left Lower Extremity:  Left common femoral artery has several areas of diffuse stenosis, each greater than 50%.  The profunda femoral artery is widely patent.  Superficial femoral artery is occluded.  There is a bypass graft going to the common femoral artery down to the below knee popliteal artery which is widely patent.  There is a distal anastomotic stenosis extending into the below knee popliteal artery.  There is two-vessel runoff via the posterior tibial and anterior tibial artery.  Intervention:  After the above images were acquired, the decision was made to proceed with intervention.  A 6 French 90 cm sheath was then advanced into the left common iliac artery.  The patient was given an additional bolus of heparin.  An 014 wire was advanced across the lesions into the bypass graft.  I selected a 5 x 80 chocolate balloon and perform balloon angioplasty of the left external iliac artery as  well as left common femoral artery.  This was performed at nominal pressure for 1 minute.  Followup angiogram revealed significant improvement in the resolution of the stenosis.  Next I set up to treat the distal anastomotic stenosis.  Do to the length, I had to remove the tuey to access the lesion.  A Fox SV 3 x 20 balloon was used to perform balloon and plasty the distal anastomotic stenosis.  I did perform an  arteriogram with the catheter in the tibioperoneal trunk with the Allis beyond the lesion.  Primary balloon and plasty was then performed of the distal anastomotic stenosis with the balloon in the bypass graft as well as the below knee popliteal artery.  The balloon was taken to nominal pressure and held up for 1 minute.  Completion arteriogram revealed resolution of the stenosis.  There is no change in runoff.  At this point catheters and wires were removed.  The long 6 French sheath was exchanged out for a 6 [place] short sheath.  Patient taken the holding area for sheath pull once her coagulation profile corrects.  There was a small soft hematoma present for the entire procedure around the sheath.  It did not expand during the procedure.  Impression:  #1  high-grade lesion within the distal left external iliac artery, greater than 90%.  This was successfully treated with balloon angioplasty using a 5 mm chocolate balloon.  The balloon extended into the common femoral artery where there were other lesions greater than 80% which responded to balloon angioplasty.  #2  successful and plasty of the below knee popliteal artery bypass graft distal anastomotic stenosis using a 3 mm balloon  #3  two-vessel runoff via the posterior tibial and anterior tibial artery   V. Annamarie Major, M.D. [facility] Office: [phone] Pager:  [phone]

## 2013-10-16 NOTE — H&P (Signed)
Patient name: [name] MRN: [id number] DOB: [birth year] Sex: female  Chief Complaint   Patient presents with   .  Re-evaluation     3 month f/u   HISTORY OF PRESENT ILLNESS:  The patient is back today for followup. She is status post left femoral to below knee popliteal artery bypass graft with vein on 12/03/2010. She has a history of iliac stenting in the left common and external iliac on 09/08/2010. She has also undergone angioplasty of the distal anastomosis on 10/26/2011. She unfortunately has started smoking again given some stressful life situations. She had quit for a while with her husband. She denies ulcers or pain in her leg.  Past Medical History   Diagnosis  Date   .  PVD (peripheral vascular disease)    .  Breast cancer      right breast, chemo and mastectomy   .  CAD (coronary artery disease)      severe   .  Tobacco abuse    .  Diabetes mellitus    .  Hyperlipidemia    .  Stroke     Past Surgical History   Procedure  Laterality  Date   .  Mastectomy     .  Below knee leg amputation       right   .  Femoral-popliteal bypass graft     .  Tubal ligation     .  Cholecystectomy     .  Hernia repair     .  Pr vein bypass graft,aorto-fem-pop     .  Carotid endarterectomy   08/10/2007   .  Cataract extraction   04/2012     Left eye    History    Social History   .  Marital Status:  Married     Spouse Name:  N/A     Number of Children:  N/A   .  Years of Education:  N/A    Occupational History   .  Not on file.    Social History Main Topics   .  Smoking status:  Current Every Day Smoker -- 1.00 packs/day     Types:  Cigarettes     Last Attempt to Quit:  08/01/2007   .  Smokeless tobacco:  Never Used   .  Alcohol Use:  No   .  Drug Use:  No   .  Sexual Activity:  Not on file    Other Topics  Concern   .  Not on file    Social History Narrative   .  No narrative on file    Family History   Problem  Relation  Age of Onset   .  Lung cancer  Mother     .  Heart disease  Father    .  Heart attack  Maternal Grandfather     Allergies as of 09/24/2013 - Review Complete 09/24/2013   Allergen  Reaction  Noted   .  Fenofibrate  Shortness Of Breath    .  Lidocaine  Anaphylaxis  11/19/2010   .  Procaine hcl [procaine hcl]  Anaphylaxis  11/19/2010   .  Atacand [candesartan cilexetil]  Other (See Comments)    .  Codeine  Other (See Comments)    .  Iohexol   07/31/2007   .  Lyrica [pregabalin]  Other (See Comments)    .  Neurontin [gabapentin]  Other (See Comments)    .  Other     .  Ramipril  Other (See Comments)    .  Soap     .  Vitamin d analogs     .  Lodine [etodolac]  Rash    .  Tetracyclines & related  Rash     Current Outpatient Prescriptions on File Prior to Visit   Medication  Sig  Dispense  Refill   .  ALPRAZolam (XANAX) 0.25 MG tablet  Take 1 tablet by mouth daily.     Marland Kitchen  ALPRAZolam (XANAX) 0.5 MG tablet  Take 0.5 mg by mouth at bedtime as needed. For anxiety     .  APIDRA SOLOSTAR 100 UNIT/ML injection  Inject 4-16 Units into the skin See admin instructions. DAILY SLIDING SCALE based on cbg readings     .  aspirin EC 81 MG tablet  Take 81 mg by mouth daily.     .  B-D INS SYR ULTRAFINE 1CC/31G 31G X 5/16" 1 ML MISC      .  B-D ULTRAFINE III SHORT PEN 31G X 8 MM MISC      .  insulin NPH-insulin regular (HUMULIN 70/30) (70-30) 100 UNIT/ML injection  Inject 55-65 Units into the skin 2 (two) times daily with a meal. 55 units in the morning  65 units at bedtime     .  ketotifen (ALAWAY) 0.025 % ophthalmic solution  Place 1 drop into both eyes 2 (two) times daily as needed. For allergies     .  Loratadine-Pseudoephedrine (ALLERGY RELIEF D PO)  Take 1 tablet by mouth daily.     .  nitroGLYCERIN (NITROSTAT) 0.4 MG SL tablet  Place 0.4 mg under the tongue every 5 (five) minutes as needed. For chest pain     .  atenolol (TENORMIN) 25 MG tablet  Take 1 tablet (25 mg total) by mouth daily.  30 tablet  12    No current  facility-administered medications on file prior to visit.   REVIEW OF SYSTEMS:  No changes from prior visit  PHYSICAL EXAMINATION:  Vital signs are BP 138/64  Pulse 74  Ht 5\' 9"  (1.753 m)  Wt 177 lb (80.287 kg)  BMI 26.13 kg/m2  SpO2 100%  General: The patient appears their stated age.  HEENT: No gross abnormalities  Pulmonary: Non labored breathing  Musculoskeletal: Right leg amputation  Neurologic: No focal weakness or paresthesias are detected,  Skin: There are no ulcer or rashes noted.  Psychiatric: The patient has normal affect.  Cardiovascular: There is a regular rate and rhythm without significant murmur appreciated.  Diagnostic Studies  Vascular lab studies were ordered and reviewed she has had a change from triphasic to monophasic waveforms in her common femoral artery, suggesting more proximal disease. Her ABI is essentially stable at 0.5. Velocity profile has increased at the distal anastomosis, it is now 308 cm/s. Velocity profile in the groin is 352 in the common femoral artery.  Assessment:  Peripheral vascular disease  Plan:  There has been a change in the waveforms in the common femoral artery, suggesting proximal disease. In addition there has been progression of the velocity profile at the distal anastomosis. The patient does have a stenosis within the common femoral artery as well. I've been monitoring this for quite some time there does appear to have been progression of the disease since her last study. I think we are at the point where intervention is necessary. Because she has an occluded iliac system on the right, this will require a left brachial approach. I  will plan on evaluating her iliac system and treating it as indicated. Also evaluate the stenosis within her common femoral artery, proximal anastomosis, and distal anastomosis and treat all that is required. This has been scheduled for Tuesday, May 5  V. Leia Alf, M.D.  [facility]  [place]  Office: [phone]  Pager: [phone]

## 2013-10-16 NOTE — Telephone Encounter (Addendum)
Message copied by Gena Fray on Tue Oct 16, 2013  2:17 PM ------      Message from: Mena Goes      Created: Tue Oct 16, 2013 12:11 PM      Regarding: schedule                   ----- Message -----         From: Mena Goes, CMA         Sent: 10/16/2013  10:56 AM           To: Mena Goes, CMA                        ----- Message -----         From: Serafina Mitchell, MD         Sent: 10/16/2013   9:46 AM           To: [facility] Charge Pool            10/16/2013:                  Surgeon:  Serafina Mitchell      Procedure Performed:       1.  ultrasound-guided access, left brachial artery       2.  abdominal aortogram with bifemoral runoff       3.  angioplasty, left external iliac artery       4.  angioplasty, left common femoral artery       5.  angioplasty, left popliteal artery/distal bypass graft anastomosis                         Schedule patient for followup in 3 months with a duplex ultrasound of her left leg with ABI, and  to see Vinnie Level ------  10/16/13: lm for pt re appt, dpm

## 2013-10-16 NOTE — Discharge Instructions (Signed)
Angiography, Care After  Refer to this sheet in the next few weeks. These instructions provide you with information on caring for yourself after your procedure. Your health care provider may also give you more specific instructions. Your treatment has been planned according to current medical practices, but problems sometimes occur. Call your health care provider if you have any problems or questions after your procedure.  WHAT TO EXPECT AFTER THE PROCEDURE After your procedure, it is typical to have the following sensations:  Minor discomfort or tenderness and a small bump at the catheter insertion site. The bump should usually decrease in size and tenderness within 1 to 2 weeks.  Any bruising will usually fade within 2 to 4 weeks. HOME CARE INSTRUCTIONS   You may need to keep taking blood thinners if they were prescribed for you. Only take over-the-counter or prescription medicines for pain, fever, or discomfort as directed by your health care provider.  Do not apply powder or lotion to the site.  Do not sit in a bathtub, swimming pool, or whirlpool for 5 to 7 days.  You may shower 24 hours after the procedure. Remove the bandage (dressing) and gently wash the site with plain soap and water. Gently pat the site dry.  Inspect the site at least twice daily.  Limit your activity for the first 24 hours. Do not bend or lift anything over 10 lb (9 kg) or as directed by your health care provider.  Do not drive home if you are discharged the day of the procedure. Have someone else drive you. Follow instructions about when you can drive or return to work. SEEK MEDICAL CARE IF:  You get lightheaded when standing up.  You have drainage (other than a small amount of blood on the dressing).  You have chills.  You have a fever.  You have redness, warmth, swelling, or pain at the insertion site. SEEK IMMEDIATE MEDICAL CARE IF:   You develop chest pain or shortness of breath, feel faint, or  pass out.  You have bleeding, swelling larger than a walnut, or drainage from the catheter insertion site.  You develop pain, discoloration, coldness, or severe bruising in the arm that held the catheter.  You have heavy bleeding from the site. If this happens, hold pressure on the site. MAKE SURE YOU:  Understand these instructions.  Will watch your condition.  Will get help right away if you are not doing well or get worse. Document Released: 12/17/2004 Document Revised: 01/31/2013 Document Reviewed: 10/23/2012 [facility] Patient Information 2014 [name].

## 2014-01-18 ENCOUNTER — Encounter: Payer: Self-pay | Admitting: Surgery

## 2014-01-21 ENCOUNTER — Ambulatory Visit (INDEPENDENT_AMBULATORY_CARE_PROVIDER_SITE_OTHER): Payer: Medicare Other | Admitting: Surgery

## 2014-01-21 ENCOUNTER — Ambulatory Visit (INDEPENDENT_AMBULATORY_CARE_PROVIDER_SITE_OTHER)
Admit: 2014-01-21 | Discharge: 2014-01-21 | Disposition: A | Discharge status: Home / Self Care | Payer: Medicare Other | Attending: Surgery | Admitting: Surgery

## 2014-01-21 ENCOUNTER — Ambulatory Visit (HOSPITAL_COMMUNITY)
Admit: 2014-01-21 | Discharge: 2014-01-21 | Disposition: A | Discharge status: Home / Self Care | Payer: Medicare Other | Source: Ambulatory Visit | Attending: Surgery | Admitting: Surgery

## 2014-01-21 ENCOUNTER — Encounter: Payer: Self-pay | Admitting: Surgery

## 2014-01-21 VITALS — BP 123/60 | HR 76 | Ht 69.0 in | Wt 179.2 lb

## 2014-01-21 DIAGNOSIS — I739 Peripheral vascular disease, unspecified: Secondary | ICD-10-CM

## 2014-01-21 DIAGNOSIS — Z48812 Encounter for surgical aftercare following surgery on the circulatory system: Secondary | ICD-10-CM

## 2014-01-21 NOTE — Progress Notes (Signed)
Patient name: [name] MRN: [id number] DOB: [birth year] Sex: female     Chief Complaint  Patient presents with  . Re-evaluation    3 month f//u     HISTORY OF PRESENT ILLNESS: The patient is back today for followup. She is status post left femoral to below knee popliteal artery bypass graft with vein on 12/03/2010. She has a history of iliac stenting in the left common and external iliac on 09/08/2010. She has also undergone angioplasty of the distal anastomosis on 10/26/2011. She unfortunately has started smoking again given some stressful life situations.  She had a tight left femoral artery and distal anastomosis.  About 3 months ago she underwent balloon angioplasty of the left external and common femoral artery using a 5 mm chocolate balloon and angioplasty of the distal anastomosis using a 3 mm balloon.  She is back for followup she has no complaints.   Past Medical History  Diagnosis Date  . PVD (peripheral vascular disease)   . Breast cancer     right breast, chemo and mastectomy  . CAD (coronary artery disease)     severe  . Tobacco abuse   . Diabetes mellitus   . Hyperlipidemia   . Stroke     Past Surgical History  Procedure Laterality Date  . Mastectomy    . Below knee leg amputation      right  . Femoral-popliteal bypass graft    . Tubal ligation    . Cholecystectomy    . Hernia repair    . Pr vein bypass graft,aorto-fem-pop    . Carotid endarterectomy  08/10/2007  . Cataract extraction  04/2012    Left eye    History   Social History  . Marital Status: Married    Spouse Name: N/A    Number of Children: N/A  . Years of Education: N/A   Occupational History  . Not on file.   Social History Main Topics  . Smoking status: Current Every Day Smoker -- 1.00 packs/day    Types: Cigarettes    Last Attempt to Quit: 08/01/2007  . Smokeless tobacco: Never Used  . Alcohol Use: No  . Drug Use: No  . Sexual Activity: Not on file   Other Topics  Concern  . Not on file   Social History Narrative  . No narrative on file    Family History  Problem Relation Age of Onset  . Lung cancer Mother   . Heart disease Father   . Heart attack Maternal Grandfather     Allergies as of 01/21/2014 - Review Complete 01/21/2014  Allergen Reaction Noted  . Fenofibrate Shortness Of Breath   . Lidocaine Anaphylaxis 11/19/2010  . Procaine hcl [procaine hcl] Anaphylaxis 11/19/2010  . Atacand [candesartan cilexetil] Other (See Comments)   . Codeine Other (See Comments)   . Iohexol  07/31/2007  . Lyrica [pregabalin] Other (See Comments)   . Neurontin [gabapentin] Other (See Comments)   . Other    . Ramipril Other (See Comments)   . Soap    . Vitamin d analogs    . Lodine [etodolac] Rash   . Tetracyclines & related Rash     Current Outpatient Prescriptions on File Prior to Visit  Medication Sig Dispense Refill  . APIDRA SOLOSTAR 100 UNIT/ML injection Inject 4-16 Units into the skin See admin instructions. DAILY SLIDING SCALE based on cbg readings      . aspirin EC 81 MG tablet Take 81 mg by  mouth daily.      . insulin NPH-insulin regular (HUMULIN 70/30) (70-30) 100 UNIT/ML injection Inject 55-65 Units into the skin 2 (two) times daily with a meal. 55 units in the morning 65 units at bedtime      . ketotifen (ALAWAY) 0.025 % ophthalmic solution Place 1 drop into both eyes 2 (two) times daily as needed. For allergies      . nitroGLYCERIN (NITROSTAT) 0.4 MG SL tablet Place 0.4 mg under the tongue every 5 (five) minutes as needed. For chest pain      . senna (SENOKOT) 8.6 MG tablet Take 1 tablet by mouth every other day.      Marland Kitchen atenolol (TENORMIN) 25 MG tablet Take 1 tablet (25 mg total) by mouth daily.  30 tablet  12   No current facility-administered medications on file prior to visit.     REVIEW OF SYSTEMS: No changes from prior visit  PHYSICAL EXAMINATION:   Vital signs are BP 123/60  Pulse 76  Ht 5\' 9"  (1.753 m)  Wt 179 lb 3.2 oz  (81.285 kg)  BMI 26.45 kg/m2  SpO2 100% General: The patient appears their stated age. HEENT:  No gross abnormalities Pulmonary:  Non labored breathing Musculoskeletal: Right leg amputation Neurologic: No focal weakness or paresthesias are detected, Skin: There are no ulcer or rashes noted. Psychiatric: The patient has normal affect. Cardiovascular: There is a regular rate and rhythm without significant murmur appreciated.   Diagnostic Studies Ultrasound has been ordered and reviewed today.  There are velocity elevations at the distal external iliac artery common femoral artery and proximal anastomosis with peak velocity of 436.  There is also elevated velocities at the distal anastomosis of 3 22 cm/s  Assessment: Peripheral vascular disease Plan: The patient underwent intervention via the left brachial approach about 3 months ago.  She has had return of her disease.  Because of the velocity profile I feel that we need to repeat her intervention.  This time I will need to be more aggressive in order to get a better durable result.  My initial plan is to proceed with orbital atherectomy with 0 coated balloon angioplasty via a left brachial approach.  This is been scheduled for Tuesday, September 1  V. Leia Alf, M.D. [facility] [place] Office: [phone] Pager:  [phone]

## 2014-01-23 ENCOUNTER — Other Ambulatory Visit: Payer: Self-pay

## 2014-01-23 DIAGNOSIS — T508X5D Adverse effect of diagnostic agents, subsequent encounter: Secondary | ICD-10-CM

## 2014-01-23 MED ORDER — PREDNISONE 50 MG PO TABS: ORAL_TABLET | ORAL | Status: DC

## 2014-01-23 MED ORDER — DIPHENHYDRAMINE HCL 50 MG PO CAPS: ORAL_CAPSULE | ORAL | Status: DC

## 2014-02-12 ENCOUNTER — Encounter (HOSPITAL_COMMUNITY): Payer: Self-pay

## 2014-02-18 MED ORDER — SODIUM CHLORIDE 0.9 % IV SOLN
INTRAVENOUS | Status: DC
  Administered 2014-02-19: 11:00:00 via INTRAVENOUS

## 2014-02-19 ENCOUNTER — Telehealth: Payer: Self-pay | Admitting: Surgery

## 2014-02-19 ENCOUNTER — Ambulatory Visit (HOSPITAL_COMMUNITY)
Admission: RE | Admit: 2014-02-19 | Discharge: 2014-02-19 | Disposition: A | Discharge status: Home / Self Care | Payer: Medicare Other | Source: Ambulatory Visit | Attending: Surgery | Admitting: Surgery

## 2014-02-19 ENCOUNTER — Encounter (HOSPITAL_COMMUNITY)
Admission: RE | Disposition: A | Discharge status: Home / Self Care | Payer: Self-pay | Source: Ambulatory Visit | Attending: Surgery

## 2014-02-19 ENCOUNTER — Other Ambulatory Visit: Payer: Self-pay | Admitting: *Deleted

## 2014-02-19 DIAGNOSIS — Z794 Long term (current) use of insulin: Secondary | ICD-10-CM | POA: Diagnosis not present

## 2014-02-19 DIAGNOSIS — S88119A Complete traumatic amputation at level between knee and ankle, unspecified lower leg, initial encounter: Secondary | ICD-10-CM | POA: Diagnosis not present

## 2014-02-19 DIAGNOSIS — Z9010 Acquired absence of unspecified breast and nipple: Secondary | ICD-10-CM | POA: Insufficient documentation

## 2014-02-19 DIAGNOSIS — Z7982 Long term (current) use of aspirin: Secondary | ICD-10-CM | POA: Diagnosis not present

## 2014-02-19 DIAGNOSIS — E785 Hyperlipidemia, unspecified: Secondary | ICD-10-CM | POA: Insufficient documentation

## 2014-02-19 DIAGNOSIS — F17200 Nicotine dependence, unspecified, uncomplicated: Secondary | ICD-10-CM | POA: Insufficient documentation

## 2014-02-19 DIAGNOSIS — Z9221 Personal history of antineoplastic chemotherapy: Secondary | ICD-10-CM | POA: Diagnosis not present

## 2014-02-19 DIAGNOSIS — E119 Type 2 diabetes mellitus without complications: Secondary | ICD-10-CM | POA: Insufficient documentation

## 2014-02-19 DIAGNOSIS — I70409 Unspecified atherosclerosis of autologous vein bypass graft(s) of the extremities, unspecified extremity: Secondary | ICD-10-CM | POA: Diagnosis present

## 2014-02-19 DIAGNOSIS — I2510 Atherosclerotic heart disease of native coronary artery without angina pectoris: Secondary | ICD-10-CM | POA: Insufficient documentation

## 2014-02-19 DIAGNOSIS — Z8673 Personal history of transient ischemic attack (TIA), and cerebral infarction without residual deficits: Secondary | ICD-10-CM | POA: Diagnosis not present

## 2014-02-19 DIAGNOSIS — Z48812 Encounter for surgical aftercare following surgery on the circulatory system: Secondary | ICD-10-CM

## 2014-02-19 DIAGNOSIS — I739 Peripheral vascular disease, unspecified: Secondary | ICD-10-CM

## 2014-02-19 HISTORY — PX: ABDOMINAL AORTAGRAM: SHX5454

## 2014-02-19 LAB — CK TOTAL AND CKMB (NOT AT ARMC)
CK, MB: 1.8 ng/mL (ref 0.3–4.0)
Relative Index: INVALID (ref 0.0–2.5)
Total CK: 67 U/L (ref 7–177)

## 2014-02-19 LAB — GLUCOSE, CAPILLARY
GLUCOSE-CAPILLARY: 272 mg/dL — AB (ref 70–99)
Glucose-Capillary: 359 mg/dL — ABNORMAL HIGH (ref 70–99)
Glucose-Capillary: 468 mg/dL — ABNORMAL HIGH (ref 70–99)
Glucose-Capillary: 396 mg/dL — ABNORMAL HIGH (ref 70–99)

## 2014-02-19 LAB — POCT I-STAT, CHEM 8
BUN: 10 mg/dL (ref 6–23)
CALCIUM ION: 1.01 mmol/L — AB (ref 1.12–1.23)
CHLORIDE: 105 meq/L (ref 96–112)
Creatinine, Ser: 0.5 mg/dL (ref 0.50–1.10)
GLUCOSE: 248 mg/dL — AB (ref 70–99)
HCT: 49 % — ABNORMAL HIGH (ref 36.0–46.0)
Hemoglobin: 16.7 g/dL — ABNORMAL HIGH (ref 12.0–15.0)
POTASSIUM: 4.1 meq/L (ref 3.7–5.3)
Sodium: 136 mEq/L — ABNORMAL LOW (ref 137–147)
TCO2: 24 mmol/L (ref 0–100)

## 2014-02-19 LAB — POCT ACTIVATED CLOTTING TIME
ACTIVATED CLOTTING TIME: 281 s
ACTIVATED CLOTTING TIME: 276 s
ACTIVATED CLOTTING TIME: 168 s
Activated Clotting Time: 197 seconds

## 2014-02-19 LAB — TROPONIN I: Troponin I: <0.3 ng/mL (ref <0.30)

## 2014-02-19 SURGERY — ABDOMINAL AORTAGRAM
Anesthesia: LOCAL

## 2014-02-19 MED ORDER — NITROGLYCERIN 1 MG/10 ML FOR IR/CATH LAB
INTRA_ARTERIAL | Status: AC
  Filled 2014-02-19: qty 10

## 2014-02-19 MED ORDER — ACETAMINOPHEN 325 MG PO TABS
325.0000 mg | ORAL_TABLET | ORAL | Status: DC | PRN
Start: 2014-02-19 — End: 2014-02-20
  Administered 2014-02-19: 650 mg via ORAL

## 2014-02-19 MED ORDER — LABETALOL HCL 5 MG/ML IV SOLN: 10.0000 mg | INTRAVENOUS | Status: DC | PRN

## 2014-02-19 MED ORDER — NITROGLYCERIN IN D5W 200-5 MCG/ML-% IV SOLN
INTRAVENOUS | Status: AC
  Filled 2014-02-19: qty 250

## 2014-02-19 MED ORDER — FENTANYL CITRATE 0.05 MG/ML IJ SOLN
25.0000 ug | Freq: Once | INTRAMUSCULAR | Status: AC
  Administered 2014-02-19: 25 ug via INTRAVENOUS

## 2014-02-19 MED ORDER — VERAPAMIL HCL 2.5 MG/ML IV SOLN
INTRAVENOUS | Status: AC
Start: 2014-02-19 — End: 2014-02-19
  Filled 2014-02-19: qty 2

## 2014-02-19 MED ORDER — ONDANSETRON HCL 4 MG/2ML IJ SOLN
4.0000 mg | Freq: Four times a day (QID) | INTRAMUSCULAR | Status: DC | PRN
Start: 2014-02-19 — End: 2014-02-20

## 2014-02-19 MED ORDER — PHENOL 1.4 % MT LIQD: 1.0000 | OROMUCOSAL | Status: DC | PRN

## 2014-02-19 MED ORDER — HEPARIN SODIUM (PORCINE) 1000 UNIT/ML IJ SOLN
INTRAMUSCULAR | Status: AC
  Filled 2014-02-19: qty 1

## 2014-02-19 MED ORDER — ACETAMINOPHEN 325 MG PO TABS
ORAL_TABLET | ORAL | Status: AC
  Filled 2014-02-19: qty 2

## 2014-02-19 MED ORDER — HEPARIN (PORCINE) IN NACL 2-0.9 UNIT/ML-% IJ SOLN
INTRAMUSCULAR | Status: AC
  Filled 2014-02-19: qty 1000

## 2014-02-19 MED ORDER — METHYLPREDNISOLONE SODIUM SUCC 125 MG IJ SOLR
INTRAMUSCULAR | Status: AC
Start: 2014-02-19 — End: 2014-02-19
  Administered 2014-02-19: 125 mg via INTRAVENOUS
  Filled 2014-02-19: qty 2

## 2014-02-19 MED ORDER — INSULIN ASPART PROT & ASPART (70-30 MIX) 100 UNIT/ML ~~LOC~~ SUSP
65.0000 [IU] | Freq: Once | SUBCUTANEOUS | Status: AC
  Administered 2014-02-19: 65 [IU] via SUBCUTANEOUS
  Filled 2014-02-19: qty 10

## 2014-02-19 MED ORDER — INSULIN ASPART 100 UNIT/ML ~~LOC~~ SOLN
SUBCUTANEOUS | Status: AC
  Filled 2014-02-19: qty 1

## 2014-02-19 MED ORDER — HYDRALAZINE HCL 20 MG/ML IJ SOLN
10.0000 mg | INTRAMUSCULAR | Status: DC | PRN
  Administered 2014-02-19: 10 mg via INTRAVENOUS

## 2014-02-19 MED ORDER — FENTANYL CITRATE 0.05 MG/ML IJ SOLN
INTRAMUSCULAR | Status: AC
  Filled 2014-02-19: qty 2

## 2014-02-19 MED ORDER — LIDOCAINE HCL (PF) 1 % IJ SOLN
INTRAMUSCULAR | Status: AC
  Filled 2014-02-19: qty 30

## 2014-02-19 MED ORDER — MIDAZOLAM HCL 2 MG/2ML IJ SOLN
INTRAMUSCULAR | Status: AC
Start: 2014-02-19 — End: 2014-02-19
  Filled 2014-02-19: qty 2

## 2014-02-19 MED ORDER — GUAIFENESIN-DM 100-10 MG/5ML PO SYRP: 15.0000 mL | ORAL_SOLUTION | ORAL | Status: DC | PRN

## 2014-02-19 MED ORDER — VERAPAMIL HCL 2.5 MG/ML IV SOLN
INTRAVENOUS | Status: AC
  Filled 2014-02-19: qty 2

## 2014-02-19 MED ORDER — SODIUM CHLORIDE 0.9 % IV SOLN: INTRAVENOUS | Status: DC

## 2014-02-19 MED ORDER — ACETAMINOPHEN 325 MG RE SUPP: 325.0000 mg | RECTAL | Status: DC | PRN

## 2014-02-19 MED ORDER — METOPROLOL TARTRATE 1 MG/ML IV SOLN: 2.0000 mg | INTRAVENOUS | Status: DC | PRN

## 2014-02-19 MED ORDER — HYDRALAZINE HCL 20 MG/ML IJ SOLN
INTRAMUSCULAR | Status: AC
  Filled 2014-02-19: qty 1

## 2014-02-19 MED ORDER — INSULIN ASPART 100 UNIT/ML ~~LOC~~ SOLN
15.0000 [IU] | Freq: Once | SUBCUTANEOUS | Status: AC
  Administered 2014-02-19: 15 [IU] via SUBCUTANEOUS

## 2014-02-19 MED ORDER — FAMOTIDINE IN NACL 20-0.9 MG/50ML-% IV SOLN
20.0000 mg | INTRAVENOUS | Status: AC
  Administered 2014-02-19: 20 mg via INTRAVENOUS

## 2014-02-19 MED ORDER — METHYLPREDNISOLONE SODIUM SUCC 125 MG IJ SOLR
125.0000 mg | INTRAMUSCULAR | Status: AC
  Administered 2014-02-19: 125 mg via INTRAVENOUS

## 2014-02-19 MED ORDER — FAMOTIDINE IN NACL 20-0.9 MG/50ML-% IV SOLN
INTRAVENOUS | Status: AC
  Administered 2014-02-19: 20 mg via INTRAVENOUS
  Filled 2014-02-19: qty 50

## 2014-02-19 MED ORDER — MIDAZOLAM HCL 2 MG/2ML IJ SOLN
INTRAMUSCULAR | Status: AC
  Filled 2014-02-19: qty 2

## 2014-02-19 SURGICAL SUPPLY — 55 items
ADH SKN CLS APL DERMABOND .7 (GAUZE/BANDAGES/DRESSINGS) ×1
BANDAGE ELASTIC 4 VELCRO ST LF (GAUZE/BANDAGES/DRESSINGS) IMPLANT
BANDAGE ESMARK 6X9 LF (GAUZE/BANDAGES/DRESSINGS) IMPLANT
BNDG CMPR 9X6 STRL LF SNTH (GAUZE/BANDAGES/DRESSINGS)
BNDG ESMARK 6X9 LF (GAUZE/BANDAGES/DRESSINGS)
CANISTER SUCTION 2500CC (MISCELLANEOUS) ×3 IMPLANT
CLIP TI MEDIUM 24 (CLIP) ×3 IMPLANT
CLIP TI WIDE RED SMALL 24 (CLIP) ×3 IMPLANT
COVER SURGICAL LIGHT HANDLE (MISCELLANEOUS) ×3 IMPLANT
CUFF TOURNIQUET SINGLE 24IN (TOURNIQUET CUFF) IMPLANT
CUFF TOURNIQUET SINGLE 34IN LL (TOURNIQUET CUFF) IMPLANT
CUFF TOURNIQUET SINGLE 44IN (TOURNIQUET CUFF) IMPLANT
DERMABOND ADVANCED (GAUZE/BANDAGES/DRESSINGS) ×1
DERMABOND ADVANCED .7 DNX12 (GAUZE/BANDAGES/DRESSINGS) ×2 IMPLANT
DRAIN CHANNEL 15F RND FF W/TCR (WOUND CARE) IMPLANT
DRAPE WARM FLUID 44X44 (DRAPE) ×3 IMPLANT
DRAPE X-RAY CASS 24X20 (DRAPES) IMPLANT
DRSG COVADERM 4X10 (GAUZE/BANDAGES/DRESSINGS) IMPLANT
DRSG COVADERM 4X8 (GAUZE/BANDAGES/DRESSINGS) IMPLANT
ELECT REM PT RETURN 9FT ADLT (ELECTROSURGICAL) ×3
ELECTRODE REM PT RTRN 9FT ADLT (ELECTROSURGICAL) ×2 IMPLANT
EVACUATOR SILICONE 100CC (DRAIN) IMPLANT
GLOVE BIOGEL PI IND STRL 7.5 (GLOVE) ×2 IMPLANT
GLOVE BIOGEL PI INDICATOR 7.5 (GLOVE) ×1
GLOVE SURG SS PI 7.5 STRL IVOR (GLOVE) ×3 IMPLANT
GOWN PREVENTION PLUS XXLARGE (GOWN DISPOSABLE) ×3 IMPLANT
GOWN STRL NON-REIN LRG LVL3 (GOWN DISPOSABLE) ×9 IMPLANT
HEMOSTAT SNOW SURGICEL 2X4 (HEMOSTASIS) IMPLANT
KIT BASIN OR (CUSTOM PROCEDURE TRAY) ×3 IMPLANT
KIT ROOM TURNOVER OR (KITS) ×3 IMPLANT
MARKER GRAFT CORONARY BYPASS (MISCELLANEOUS) IMPLANT
NS IRRIG 1000ML POUR BTL (IV SOLUTION) ×6 IMPLANT
PACK PERIPHERAL VASCULAR (CUSTOM PROCEDURE TRAY) ×3 IMPLANT
PAD ARMBOARD 7.5X6 YLW CONV (MISCELLANEOUS) ×6 IMPLANT
PADDING CAST COTTON 6X4 STRL (CAST SUPPLIES) IMPLANT
SET COLLECT BLD 21X3/4 12 (NEEDLE) IMPLANT
STOPCOCK 4 WAY LG BORE MALE ST (IV SETS) IMPLANT
SUT ETHILON 3 0 PS 1 (SUTURE) IMPLANT
SUT PROLENE 5 0 C 1 24 (SUTURE) ×3 IMPLANT
SUT PROLENE 6 0 BV (SUTURE) ×3 IMPLANT
SUT PROLENE 7 0 BV 1 (SUTURE) IMPLANT
SUT SILK 2 0 SH (SUTURE) ×3 IMPLANT
SUT SILK 3 0 (SUTURE)
SUT SILK 3-0 18XBRD TIE 12 (SUTURE) IMPLANT
SUT VIC AB 2-0 CT1 27 (SUTURE) ×4
SUT VIC AB 2-0 CT1 TAPERPNT 27 (SUTURE) ×4 IMPLANT
SUT VIC AB 3-0 SH 27 (SUTURE) ×4
SUT VIC AB 3-0 SH 27X BRD (SUTURE) ×4 IMPLANT
SUT VICRYL 4-0 PS2 18IN ABS (SUTURE) ×6 IMPLANT
TOWEL OR 17X24 6PK STRL BLUE (TOWEL DISPOSABLE) ×6 IMPLANT
TOWEL OR 17X26 10 PK STRL BLUE (TOWEL DISPOSABLE) ×6 IMPLANT
TRAY FOLEY CATH 16FRSI W/METER (SET/KITS/TRAYS/PACK) ×3 IMPLANT
TUBING EXTENTION W/L.L. (IV SETS) IMPLANT
UNDERPAD 30X30 INCONTINENT (UNDERPADS AND DIAPERS) ×3 IMPLANT
WATER STERILE IRR 1000ML POUR (IV SOLUTION) ×3 IMPLANT

## 2014-02-19 NOTE — Progress Notes (Signed)
Blood drawn for cardiac enzymes and sent to lab. CP gone. Head ache down to a 1/10

## 2014-02-19 NOTE — Progress Notes (Signed)
Pts repeat CBG is 468.  Dr. Trula Slade notified. Orders received. Pt given her usual home dose of insulin  see MAR)

## 2014-02-19 NOTE — Progress Notes (Signed)
Pt was able to void 500cc of clear yellow urine.

## 2014-02-19 NOTE — Progress Notes (Signed)
Dr. Trula Slade made aware of CP 4/10. O2 2L [place] applied. 12 lead EKG done. Orders taken

## 2014-02-19 NOTE — Progress Notes (Signed)
Site area: left brachial Site Prior to Removal:  Level 1 Pressure Applied For: 25 minutes; removed by BRobinson Manual:   yes Patient Status During Pull:  stable Post Pull Site:  Level 1. Hematoma mostly resolved. Slight firmness medial to stick Post Pull Instructions Given:  yes Post Pull Pulses Present: yes Dressing Applied:  Yes, fresh arm board applied Bedrest begins @ 2336 Comments: no complications

## 2014-02-19 NOTE — Interval H&P Note (Signed)
History and Physical Interval Note:  02/19/2014 11:20 AM  [name]  has presented today for surgery, with the diagnosis of pvd  The various methods of treatment have been discussed with the patient and family. After consideration of risks, benefits and other options for treatment, the patient has consented to  Procedure(s): ABDOMINAL AORTAGRAM (N/A) as a surgical intervention .  The patient's history has been reviewed, patient examined, no change in status, stable for surgery.  I have reviewed the patient's chart and labs.  Questions were answered to the patient's satisfaction.     Tjay Velazquez IV, V. WELLS

## 2014-02-19 NOTE — Telephone Encounter (Addendum)
Message copied by Gena Fray on Tue Feb 19, 2014  3:54 PM ------      Message from: Mena Goes      Created: Tue Feb 19, 2014  2:48 PM      Regarding: Schedule                   ----- Message -----         From: Serafina Mitchell, MD         Sent: 02/19/2014   2:38 PM           To: [facility] Charge Pool            02/19/2014:            Surgeon:  Eldridge Abrahams      Procedure Performed:                  1.  ultrasound-guided access, left brachial artery                  2.  abdominal aortogram                  3.  left lower extremity runoff                  4.  additional order catheterization                  5.  atherectomy, left external iliac artery                  6.  atherectomy left common femoral artery                  7.  drug coated balloon angioplasty, left external iliac artery                    8.  Drug coated balloon angioplasty, Left common femoral artery                  9.  failed angioplasty, left below knee popliteal artery                  Schedule patient to followup with Vinnie Level in 6 months with a duplex and ankle-brachial indices ------  02/19/14: mailed appt letter to home address, as home # did not give opt to lv msg, dpm

## 2014-02-19 NOTE — H&P (View-Only) (Signed)
Patient name: [name] MRN: [id number] DOB: March 27, 1956 Sex: female     Chief Complaint  Patient presents with  . Re-evaluation    3 month f//u     HISTORY OF PRESENT ILLNESS: The patient is back today for followup. She is status post left femoral to below knee popliteal artery bypass graft with vein on 12/03/2010. She has a history of iliac stenting in the left common and external iliac on 09/08/2010. She has also undergone angioplasty of the distal anastomosis on 10/26/2011. She unfortunately has started smoking again given some stressful life situations.  She had a tight left femoral artery and distal anastomosis.  About 3 months ago she underwent balloon angioplasty of the left external and common femoral artery using a 5 mm chocolate balloon and angioplasty of the distal anastomosis using a 3 mm balloon.  She is back for followup she has no complaints.   Past Medical History  Diagnosis Date  . PVD (peripheral vascular disease)   . Breast cancer     right breast, chemo and mastectomy  . CAD (coronary artery disease)     severe  . Tobacco abuse   . Diabetes mellitus   . Hyperlipidemia   . Stroke     Past Surgical History  Procedure Laterality Date  . Mastectomy    . Below knee leg amputation      right  . Femoral-popliteal bypass graft    . Tubal ligation    . Cholecystectomy    . Hernia repair    . Pr vein bypass graft,aorto-fem-pop    . Carotid endarterectomy  08/10/2007  . Cataract extraction  04/2012    Left eye    History   Social History  . Marital Status: Married    Spouse Name: N/A    Number of Children: N/A  . Years of Education: N/A   Occupational History  . Not on file.   Social History Main Topics  . Smoking status: Current Every Day Smoker -- 1.00 packs/day    Types: Cigarettes    Last Attempt to Quit: 08/01/2007  . Smokeless tobacco: Never Used  . Alcohol Use: No  . Drug Use: No  . Sexual Activity: Not on file   Other Topics  Concern  . Not on file   Social History Narrative  . No narrative on file    Family History  Problem Relation Age of Onset  . Lung cancer Mother   . Heart disease Father   . Heart attack Maternal Grandfather     Allergies as of 01/21/2014 - Review Complete 01/21/2014  Allergen Reaction Noted  . Fenofibrate Shortness Of Breath   . Lidocaine Anaphylaxis 11/19/2010  . Procaine hcl [procaine hcl] Anaphylaxis 11/19/2010  . Atacand [candesartan cilexetil] Other (See Comments)   . Codeine Other (See Comments)   . Iohexol  07/31/2007  . Lyrica [pregabalin] Other (See Comments)   . Neurontin [gabapentin] Other (See Comments)   . Other    . Ramipril Other (See Comments)   . Soap    . Vitamin d analogs    . Lodine [etodolac] Rash   . Tetracyclines & related Rash     Current Outpatient Prescriptions on File Prior to Visit  Medication Sig Dispense Refill  . APIDRA SOLOSTAR 100 UNIT/ML injection Inject 4-16 Units into the skin See admin instructions. DAILY SLIDING SCALE based on cbg readings      . aspirin EC 81 MG tablet Take 81 mg by  mouth daily.      . insulin NPH-insulin regular (HUMULIN 70/30) (70-30) 100 UNIT/ML injection Inject 55-65 Units into the skin 2 (two) times daily with a meal. 55 units in the morning 65 units at bedtime      . ketotifen (ALAWAY) 0.025 % ophthalmic solution Place 1 drop into both eyes 2 (two) times daily as needed. For allergies      . nitroGLYCERIN (NITROSTAT) 0.4 MG SL tablet Place 0.4 mg under the tongue every 5 (five) minutes as needed. For chest pain      . senna (SENOKOT) 8.6 MG tablet Take 1 tablet by mouth every other day.      Marland Kitchen atenolol (TENORMIN) 25 MG tablet Take 1 tablet (25 mg total) by mouth daily.  30 tablet  12   No current facility-administered medications on file prior to visit.     REVIEW OF SYSTEMS: No changes from prior visit  PHYSICAL EXAMINATION:   Vital signs are BP 123/60  Pulse 76  Ht 5\' 9"  (1.753 m)  Wt 179 lb 3.2 oz  (81.285 kg)  BMI 26.45 kg/m2  SpO2 100% General: The patient appears their stated age. HEENT:  No gross abnormalities Pulmonary:  Non labored breathing Musculoskeletal: Right leg amputation Neurologic: No focal weakness or paresthesias are detected, Skin: There are no ulcer or rashes noted. Psychiatric: The patient has normal affect. Cardiovascular: There is a regular rate and rhythm without significant murmur appreciated.   Diagnostic Studies Ultrasound has been ordered and reviewed today.  There are velocity elevations at the distal external iliac artery common femoral artery and proximal anastomosis with peak velocity of 436.  There is also elevated velocities at the distal anastomosis of 3 22 cm/s  Assessment: Peripheral vascular disease Plan: The patient underwent intervention via the left brachial approach about 3 months ago.  She has had return of her disease.  Because of the velocity profile I feel that we need to repeat her intervention.  This time I will need to be more aggressive in order to get a better durable result.  My initial plan is to proceed with orbital atherectomy with 0 coated balloon angioplasty via a left brachial approach.  This is been scheduled for Tuesday, September 1  V. Leia Alf, M.D. [facility] Office: [phone] Pager:  [phone]

## 2014-02-19 NOTE — Discharge Instructions (Signed)
Angiogram, Care After Refer to this sheet in the next few weeks. These instructions provide you with information on caring for yourself after your procedure. Your health care provider may also give you more specific instructions. Your treatment has been planned according to current medical practices, but problems sometimes occur. Call your health care provider if you have any problems or questions after your procedure.  WHAT TO EXPECT AFTER THE PROCEDURE After your procedure, it is typical to have the following sensations:  Minor discomfort or tenderness and a small bump at the catheter insertion site. The bump should usually decrease in size and tenderness within 1 to 2 weeks.  Any bruising will usually fade within 2 to 4 weeks. HOME CARE INSTRUCTIONS   You may need to keep taking blood thinners if they were prescribed for you. Take medicines only as directed by your health care provider.  Do not apply powder or lotion to the site.  Do not take baths, swim, or use a hot tub until your health care provider approves.  You may shower 24 hours after the procedure. Remove the bandage (dressing) and gently wash the site with plain soap and water. Gently pat the site dry.  Inspect the site at least twice daily.  Limit your activity for the first 48 hours. Do not bend, squat, or lift anything over 3-5 lb (9 kg) or as directed by your health care provider.  Plan to have someone take you home after the procedure. Follow instructions about when you can drive or return to work. SEEK MEDICAL CARE IF:  You get light-headed when standing up.  You have drainage (other than a small amount of blood on the dressing).  You have chills.  You have a fever.  You have redness, warmth, swelling, or pain at the insertion site. SEEK IMMEDIATE MEDICAL CARE IF:   You develop chest pain or shortness of breath, feel faint, or pass out.  You have bleeding, swelling larger than a walnut, or drainage from the  catheter insertion site.  You develop pain, discoloration, coldness, or severe bruising in the leg or arm that held the catheter.  You develop bleeding from any other place, such as the bowels. You may see bright red blood in your urine or stools, or your stools may appear black and tarry.  You have heavy bleeding from the site. If this happens, hold pressure on the site.   MAKE SURE YOU:  Understand these instructions.  Will watch your condition.  Will get help right away if you are not doing well or get worse. Document Released: 12/17/2004 Document Revised: 10/15/2013 Document Reviewed: 10/23/2012 [facility] Patient Information 2015 [place], [place]. This information is not intended to replace advice given to you by your health care provider. Make sure you discuss any questions you have with your health care provider.

## 2014-02-19 NOTE — Op Note (Signed)
Patient name: [name] MRN: [id number] DOB: [birth year] Sex: female  02/19/2014 Pre-operative Diagnosis: BPG stenosis Post-operative diagnosis:  Same Surgeon:  Eldridge Abrahams Procedure Performed:  1.  ultrasound-guided access, left brachial artery  2.  abdominal aortogram  3.  left lower extremity runoff  4.  additional order catheterization  5.  atherectomy, left external iliac artery  6.  atherectomy left common femoral artery  7.  drug coated balloon angioplasty, left external iliac artery   8.  Drug coated balloon angioplasty, Left common femoral artery  9.  failed angioplasty, left below knee popliteal artery   Indications:  The patient has undergone left femoral-popliteal bypass graft for limb salvage.  She has had recurrence of disease.  She recently underwent angioplasty in May but has developed recurrent stenosis within the in flow to the graft as well as at the distal anastomosis.  She's here for further treatment.  Procedure:  The patient was identified in the holding area and taken to room 8.  The patient was then placed supine on the table and prepped and draped in the usual sterile fashion.  A time out was called.  Ultrasound was used to evaluate the left brachial artery.  It was widely patent and compressible without significant calcification.  One percent lidocaine was used for local anesthesia.  The left brachial artery was then cannulated under ultrasound guidance with a micropuncture needle.  An 018 wire was advanced without resistance, followed by a micropuncture sheath.  Next, a 035 Wholey wire was inserted, followed by a 5 [place] sheath.  Using the woolly wire and pigtail catheter, the catheter was placed into the distal abdominal aorta and an abdominal aortogram was performed.  Next using a Kumpe catheter, the left common iliac artery was selected and left lower extremity runoff was performed.  Findings:   Aortogram:  The distal dominant aorta is patent  however diffuse luminal irregularity is noted without significant stenosis.  There is significant calcification within the artery.  The stents within the left common iliac artery are patent.  There is a stent within the left external iliac artery which shows greater than 70% stenosis.  The distal external iliac artery and common femoral artery proximal to the proximal anastomosis showed diffuse luminal irregularity with stenoses greater than 50% present.  Right Lower Extremity:  Not evaluated  Left Lower Extremity:  The left common femoral artery is diffusely diseased.  The proximal anastomoses is widely patent.  The profunda femoral artery is patent throughout it's course without significant stenosis.  The bypass graft is patent down to the distal anastomosis which is to the below-knee popliteal artery.  There is a greater than 70% stenosis in the below knee popliteal artery.  Three-vessel runoff is identified however the tibial vessels are diffusely diseased.  Intervention:  After the above images were acquired, the decision was made to proceed with intervention.  A 6 French 55 cm sheath was inserted.  The patient was fully heparinized.  Wire access was obtained into the bypass graft.  This was switched out to a Viper wire.  The CSI orbital atherectomy device was selected.  Upon prepping and the device the bag with solution burst all over the table.  This required [place] to completely reprep and redrape the patient.  The wire which was artery and position was wiped down with chlorhexidine and alcohol.  A CSI 2.0 plastic device was used to perform atherectomy  in the left external iliac artery and  left common femoral artery and low medium and high speed.  I then performed drug coated balloon angioplasty of the left external iliac and left common femoral artery.  Completion angiography revealed adequate results.  I then imaged the lower left leg.  It appeared that the distal anastomosis which had a high-grade  stenosis was now occluded.  I was able to get a wire to go across the anastomosis and prepared to perform balloon angioplasty of this area.  I was able to advance a 2.5 x 80 balloon down to the distal anastomosis, however because of length issues it could not across the distal anastomosis.  I then inserted a 035 catheter and used a touey to perform injection.  This showed that the below knee anastomosis was now patent and looked significantly improved from pre-intervention.  With this I elected not to treat this lesion again.  Most likely oozing the wire through the anastomosis, daughtered the area and had the distal anastomosis appeared widely patent.  I then took a final image of the iliac and common femoral treated area which also looked widely patent.  At this point the catheters and wires were removed.  I switched out the 6 French 55 cm sheath for a short 6 [place] sheath.  The patient tolerated the procedure well.  There were no immediate complications.  Impression:  #1  successful atherectomy with drug coated balloon angioplasty of the left external iliac artery and left common femoral artery  #2  unable to treat the below knee popliteal artery anastomosis because we do not have balloons that our long enough to reach from the brachial artery to the below knee popliteal artery.  However, after the procedure the distal anastomosis looked adequate.  #3  diffusely diseased tibial vessels   V. Annamarie Major, M.D. [facility] Office: [phone] Pager:  [phone]

## 2014-05-23 ENCOUNTER — Encounter (HOSPITAL_COMMUNITY): Payer: Self-pay | Admitting: Surgery

## 2014-08-23 ENCOUNTER — Encounter: Payer: Self-pay | Admitting: Family

## 2014-08-26 ENCOUNTER — Other Ambulatory Visit (HOSPITAL_COMMUNITY): Payer: Medicare Other

## 2014-08-26 ENCOUNTER — Ambulatory Visit (HOSPITAL_COMMUNITY)
Admission: RE | Admit: 2014-08-26 | Discharge: 2014-08-26 | Disposition: A | Discharge status: Home / Self Care | Payer: Medicare Other | Source: Ambulatory Visit | Attending: Family | Admitting: Family

## 2014-08-26 ENCOUNTER — Encounter: Payer: Self-pay | Admitting: Family

## 2014-08-26 ENCOUNTER — Ambulatory Visit: Payer: Medicare Other | Admitting: Family

## 2014-08-26 ENCOUNTER — Encounter (HOSPITAL_COMMUNITY): Payer: Medicare Other

## 2014-08-26 ENCOUNTER — Ambulatory Visit (INDEPENDENT_AMBULATORY_CARE_PROVIDER_SITE_OTHER): Payer: Medicare Other | Admitting: Family

## 2014-08-26 ENCOUNTER — Ambulatory Visit (INDEPENDENT_AMBULATORY_CARE_PROVIDER_SITE_OTHER)
Admission: RE | Admit: 2014-08-26 | Discharge: 2014-08-26 | Disposition: A | Discharge status: Home / Self Care | Payer: Medicare Other | Source: Ambulatory Visit | Attending: Family | Admitting: Family

## 2014-08-26 VITALS — BP 113/73 | HR 94 | Resp 16 | Ht 69.0 in | Wt 177.0 lb

## 2014-08-26 DIAGNOSIS — Z9889 Other specified postprocedural states: Secondary | ICD-10-CM

## 2014-08-26 DIAGNOSIS — Z48812 Encounter for surgical aftercare following surgery on the circulatory system: Secondary | ICD-10-CM | POA: Diagnosis not present

## 2014-08-26 DIAGNOSIS — Z720 Tobacco use: Secondary | ICD-10-CM | POA: Diagnosis not present

## 2014-08-26 DIAGNOSIS — E1159 Type 2 diabetes mellitus with other circulatory complications: Secondary | ICD-10-CM | POA: Diagnosis not present

## 2014-08-26 DIAGNOSIS — Z95828 Presence of other vascular implants and grafts: Secondary | ICD-10-CM

## 2014-08-26 DIAGNOSIS — E1151 Type 2 diabetes mellitus with diabetic peripheral angiopathy without gangrene: Secondary | ICD-10-CM

## 2014-08-26 DIAGNOSIS — I739 Peripheral vascular disease, unspecified: Secondary | ICD-10-CM | POA: Insufficient documentation

## 2014-08-26 DIAGNOSIS — F17200 Nicotine dependence, unspecified, uncomplicated: Secondary | ICD-10-CM

## 2014-08-26 NOTE — Patient Instructions (Signed)
Peripheral Vascular Disease Peripheral Vascular Disease (PVD), also called Peripheral Arterial Disease (PAD), is a circulation problem caused by cholesterol (atherosclerotic plaque) deposits in the arteries. PVD commonly occurs in the lower extremities (legs) but it can occur in other areas of the body, such as your arms. The cholesterol buildup in the arteries reduces blood flow which can cause pain and other serious problems. The presence of PVD can place a person at risk for Coronary Artery Disease (CAD).  CAUSES  Causes of PVD can be many. It is usually associated with more than one risk factor such as:   High Cholesterol.  Smoking.  Diabetes.  Lack of exercise or inactivity.  High blood pressure (hypertension).  Obesity.  Family history. SYMPTOMS   When the lower extremities are affected, patients with PVD may experience:  Leg pain with exertion or physical activity. This is called INTERMITTENT CLAUDICATION. This may present as cramping or numbness with physical activity. The location of the pain is associated with the level of blockage. For example, blockage at the abdominal level (distal abdominal aorta) may result in buttock or hip pain. Lower leg arterial blockage may result in calf pain.  As PVD becomes more severe, pain can develop with less physical activity.  In people with severe PVD, leg pain may occur at rest.  Other PVD signs and symptoms:  Leg numbness or weakness.  Coldness in the affected leg or foot, especially when compared to the other leg.  A change in leg color.  Patients with significant PVD are more prone to ulcers or sores on toes, feet or legs. These may take longer to heal or may reoccur. The ulcers or sores can become infected.  If signs and symptoms of PVD are ignored, gangrene may occur. This can result in the loss of toes or loss of an entire limb.  Not all leg pain is related to PVD. Other medical conditions can cause leg pain such  as:  Blood clots (embolism) or Deep Vein Thrombosis.  Inflammation of the blood vessels (vasculitis).  Spinal stenosis. DIAGNOSIS  Diagnosis of PVD can involve several different types of tests. These can include:  Pulse Volume Recording Method (PVR). This test is simple, painless and does not involve the use of X-rays. PVR involves measuring and comparing the blood pressure in the arms and legs. An ABI (Ankle-Brachial Index) is calculated. The normal ratio of blood pressures is 1. As this number becomes smaller, it indicates more severe disease.  < 0.95 - indicates significant narrowing in one or more leg vessels.  <0.8 - there will usually be pain in the foot, leg or buttock with exercise.  <0.4 - will usually have pain in the legs at rest.  <0.25 - usually indicates limb threatening PVD.  Doppler detection of pulses in the legs. This test is painless and checks to see if you have a pulses in your legs/feet.  A dye or contrast material (a substance that highlights the blood vessels so they show up on x-ray) may be given to help your caregiver better see the arteries for the following tests. The dye is eliminated from your body by the kidney's. Your caregiver may order blood work to check your kidney function and other laboratory values before the following tests are performed:  Magnetic Resonance Angiography (MRA). An MRA is a picture study of the blood vessels and arteries. The MRA machine uses a large magnet to produce images of the blood vessels.  Computed Tomography Angiography (CTA). A CTA   is a specialized x-ray that looks at how the blood flows in your blood vessels. An IV may be inserted into your arm so contrast dye can be injected.  Angiogram. Is a procedure that uses x-rays to look at your blood vessels. This procedure is minimally invasive, meaning a small incision (cut) is made in your groin. A small tube (catheter) is then inserted into the artery of your groin. The catheter  is guided to the blood vessel or artery your caregiver wants to examine. Contrast dye is injected into the catheter. X-rays are then taken of the blood vessel or artery. After the images are obtained, the catheter is taken out. TREATMENT  Treatment of PVD involves many interventions which may include:  Lifestyle changes:  Quitting smoking.  Exercise.  Following a low fat, low cholesterol diet.  Control of diabetes.  Foot care is very important to the PVD patient. Good foot care can help prevent infection.  Medication:  Cholesterol-lowering medicine.  Blood pressure medicine.  Anti-platelet drugs.  Certain medicines may reduce symptoms of Intermittent Claudication.  Interventional/Surgical options:  Angioplasty. An Angioplasty is a procedure that inflates a balloon in the blocked artery. This opens the blocked artery to improve blood flow.  Stent Implant. A wire mesh tube (stent) is placed in the artery. The stent expands and stays in place, allowing the artery to remain open.  Peripheral Bypass Surgery. This is a surgical procedure that reroutes the blood around a blocked artery to help improve blood flow. This type of procedure may be performed if Angioplasty or stent implants are not an option. SEEK IMMEDIATE MEDICAL CARE IF:   You develop pain or numbness in your arms or legs.  Your arm or leg turns cold, becomes blue in color.  You develop redness, warmth, swelling and pain in your arms or legs. MAKE SURE YOU:   Understand these instructions.  Will watch your condition.  Will get help right away if you are not doing well or get worse. Document Released: 07/08/2004 Document Revised: 08/23/2011 Document Reviewed: 06/04/2008 [employer] Patient Information 2015 [employer]. This information is not intended to replace advice given to you by your health care provider. Make sure you discuss any questions you have with your health care provider.    Smoking  Cessation Quitting smoking is important to your health and has many advantages. However, it is not always easy to quit since nicotine is a very addictive drug. Oftentimes, people try 3 times or more before being able to quit. This document explains the best ways for you to prepare to quit smoking. Quitting takes hard work and a lot of effort, but you can do it. ADVANTAGES OF QUITTING SMOKING  You will live longer, feel better, and live better.  Your body will feel the impact of quitting smoking almost immediately.  Within 20 minutes, blood pressure decreases. Your pulse returns to its normal level.  After 8 hours, carbon monoxide levels in the blood return to normal. Your oxygen level increases.  After 24 hours, the chance of having a heart attack starts to decrease. Your breath, hair, and body stop smelling like smoke.  After 48 hours, damaged nerve endings begin to recover. Your sense of taste and smell improve.  After 72 hours, the body is virtually free of nicotine. Your bronchial tubes relax and breathing becomes easier.  After 2 to 12 weeks, lungs can hold more air. Exercise becomes easier and circulation improves.  The risk of having a heart attack, stroke,   cancer, or lung disease is greatly reduced.  After 1 year, the risk of coronary heart disease is cut in half.  After 5 years, the risk of stroke falls to the same as a nonsmoker.  After 10 years, the risk of lung cancer is cut in half and the risk of other cancers decreases significantly.  After 15 years, the risk of coronary heart disease drops, usually to the level of a nonsmoker.  If you are pregnant, quitting smoking will improve your chances of having a healthy baby.  The people you live with, especially any children, will be healthier.  You will have extra money to spend on things other than cigarettes. QUESTIONS TO THINK ABOUT BEFORE ATTEMPTING TO QUIT You may want to talk about your answers with your health care  provider.  Why do you want to quit?  If you tried to quit in the past, what helped and what did not?  What will be the most difficult situations for you after you quit? How will you plan to handle them?  Who can help you through the tough times? Your family? Friends? A health care provider?  What pleasures do you get from smoking? What ways can you still get pleasure if you quit? Here are some questions to ask your health care provider:  How can you help me to be successful at quitting?  What medicine do you think would be best for me and how should I take it?  What should I do if I need more help?  What is smoking withdrawal like? How can I get information on withdrawal? GET READY  Set a quit date.  Change your environment by getting rid of all cigarettes, ashtrays, matches, and lighters in your home, car, or work. Do not let people smoke in your home.  Review your past attempts to quit. Think about what worked and what did not. GET SUPPORT AND ENCOURAGEMENT You have a better chance of being successful if you have help. You can get support in many ways.  Tell your family, friends, and coworkers that you are going to quit and need their support. Ask them not to smoke around you.  Get individual, group, or telephone counseling and support. Programs are available at local hospitals and health centers. Call your local health department for information about programs in your area.  Spiritual beliefs and practices may help some smokers quit.  Download a "quit meter" on your computer to keep track of quit statistics, such as how long you have gone without smoking, cigarettes not smoked, and money saved.  Get a self-help book about quitting smoking and staying off tobacco. LEARN NEW SKILLS AND BEHAVIORS  Distract yourself from urges to smoke. Talk to someone, go for a walk, or occupy your time with a task.  Change your normal routine. Take a different route to work. Drink tea  instead of coffee. Eat breakfast in a different place.  Reduce your stress. Take a hot bath, exercise, or read a book.  Plan something enjoyable to do every day. Reward yourself for not smoking.  Explore interactive web-based programs that specialize in helping you quit. GET MEDICINE AND USE IT CORRECTLY Medicines can help you stop smoking and decrease the urge to smoke. Combining medicine with the above behavioral methods and support can greatly increase your chances of successfully quitting smoking.  Nicotine replacement therapy helps deliver nicotine to your body without the negative effects and risks of smoking. Nicotine replacement therapy includes nicotine gum, lozenges,   inhalers, nasal sprays, and skin patches. Some may be available over-the-counter and others require a prescription.  Antidepressant medicine helps people abstain from smoking, but how this works is unknown. This medicine is available by prescription.  Nicotinic receptor partial agonist medicine simulates the effect of nicotine in your brain. This medicine is available by prescription. Ask your health care provider for advice about which medicines to use and how to use them based on your health history. Your health care provider will tell you what side effects to look out for if you choose to be on a medicine or therapy. Carefully read the information on the package. Do not use any other product containing nicotine while using a nicotine replacement product.  RELAPSE OR DIFFICULT SITUATIONS Most relapses occur within the first 3 months after quitting. Do not be discouraged if you start smoking again. Remember, most people try several times before finally quitting. You may have symptoms of withdrawal because your body is used to nicotine. You may crave cigarettes, be irritable, feel very hungry, cough often, get headaches, or have difficulty concentrating. The withdrawal symptoms are only temporary. They are strongest when you  first quit, but they will go away within 10-14 days. To reduce the chances of relapse, try to:  Avoid drinking alcohol. Drinking lowers your chances of successfully quitting.  Reduce the amount of caffeine you consume. Once you quit smoking, the amount of caffeine in your body increases and can give you symptoms, such as a rapid heartbeat, sweating, and anxiety.  Avoid smokers because they can make you want to smoke.  Do not let weight gain distract you. Many smokers will gain weight when they quit, usually less than 10 pounds. Eat a healthy diet and stay active. You can always lose the weight gained after you quit.  Find ways to improve your mood other than smoking. FOR MORE INFORMATION  www.smokefree.gov  Document Released: 05/25/2001 Document Revised: 10/15/2013 Document Reviewed: 09/09/2011 [employer] Patient Information 2015 [employer]. This information is not intended to replace advice given to you by your health care provider. Make sure you discuss any questions you have with your health care provider.    Smoking Cessation, Tips for Success If you are ready to quit smoking, congratulations! You have chosen to help yourself be healthier. Cigarettes bring nicotine, tar, carbon monoxide, and other irritants into your body. Your lungs, heart, and blood vessels will be able to work better without these poisons. There are many different ways to quit smoking. Nicotine gum, nicotine patches, a nicotine inhaler, or nicotine nasal spray can help with physical craving. Hypnosis, support groups, and medicines help break the habit of smoking. WHAT THINGS CAN I DO TO MAKE QUITTING EASIER?  Here are some tips to help you quit for good:  Pick a date when you will quit smoking completely. Tell all of your friends and family about your plan to quit on that date.  Do not try to slowly cut down on the number of cigarettes you are smoking. Pick a quit date and quit smoking completely starting on that  day.  Throw away all cigarettes.   Clean and remove all ashtrays from your home, work, and car.  On a card, write down your reasons for quitting. Carry the card with you and read it when you get the urge to smoke.  Cleanse your body of nicotine. Drink enough water and fluids to keep your urine clear or pale yellow. Do this after quitting to flush the nicotine from   your body.  Learn to predict your moods. Do not let a bad situation be your excuse to have a cigarette. Some situations in your life might tempt you into wanting a cigarette.  Never have "just one" cigarette. It leads to wanting another and another. Remind yourself of your decision to quit.  Change habits associated with smoking. If you smoked while driving or when feeling stressed, try other activities to replace smoking. Stand up when drinking your coffee. Brush your teeth after eating. Sit in a different chair when you read the paper. Avoid alcohol while trying to quit, and try to drink fewer caffeinated beverages. Alcohol and caffeine may urge you to smoke.  Avoid foods and drinks that can trigger a desire to smoke, such as sugary or spicy foods and alcohol.  Ask people who smoke not to smoke around you.  Have something planned to do right after eating or having a cup of coffee. For example, plan to take a walk or exercise.  Try a relaxation exercise to calm you down and decrease your stress. Remember, you may be tense and nervous for the first 2 weeks after you quit, but this will pass.  Find new activities to keep your hands busy. Play with a pen, coin, or rubber band. Doodle or draw things on paper.  Brush your teeth right after eating. This will help cut down on the craving for the taste of tobacco after meals. You can also try mouthwash.   Use oral substitutes in place of cigarettes. Try using lemon drops, carrots, cinnamon sticks, or chewing gum. Keep them handy so they are available when you have the urge to  smoke.  When you have the urge to smoke, try deep breathing.  Designate your home as a nonsmoking area.  If you are a heavy smoker, ask your health care provider about a prescription for nicotine chewing gum. It can ease your withdrawal from nicotine.  Reward yourself. Set aside the cigarette money you save and buy yourself something nice.  Look for support from others. Join a support group or smoking cessation program. Ask someone at home or at work to help you with your plan to quit smoking.  Always ask yourself, "Do I need this cigarette or is this just a reflex?" Tell yourself, "Today, I choose not to smoke," or "I do not want to smoke." You are reminding yourself of your decision to quit.  Do not replace cigarette smoking with electronic cigarettes (commonly called e-cigarettes). The safety of e-cigarettes is unknown, and some may contain harmful chemicals.  If you relapse, do not give up! Plan ahead and think about what you will do the next time you get the urge to smoke. HOW WILL I FEEL WHEN I QUIT SMOKING? You may have symptoms of withdrawal because your body is used to nicotine (the addictive substance in cigarettes). You may crave cigarettes, be irritable, feel very hungry, cough often, get headaches, or have difficulty concentrating. The withdrawal symptoms are only temporary. They are strongest when you first quit but will go away within 10-14 days. When withdrawal symptoms occur, stay in control. Think about your reasons for quitting. Remind yourself that these are signs that your body is healing and getting used to being without cigarettes. Remember that withdrawal symptoms are easier to treat than the major diseases that smoking can cause.  Even after the withdrawal is over, expect periodic urges to smoke. However, these cravings are generally short lived and will go away whether you   smoke or not. Do not smoke! WHAT RESOURCES ARE AVAILABLE TO HELP ME QUIT SMOKING? Your health care  provider can direct you to community resources or hospitals for support, which may include:  Group support.  Education.  Hypnosis.  Therapy. Document Released: 02/27/2004 Document Revised: 10/15/2013 Document Reviewed: 11/16/2012 [employer] Patient Information 2015 [employer]. This information is not intended to replace advice given to you by your health care provider. Make sure you discuss any questions you have with your health care provider.  

## 2014-08-26 NOTE — Progress Notes (Signed)
[facility] [place] HISTORY AND PHYSICAL -PAD  History of Present Illness [name] is a 59 y.o. female patient of Dr. Trula Slade is back today for followup. She is status post left femoral to below knee popliteal artery bypass graft with vein on 12/03/2010. She has a history of iliac stenting in the left common and external iliac on 09/08/2010. She has also undergone angioplasty of the distal anastomosis on 10/26/2011. She unfortunately has started smoking again given some stressful life situations. She had a tight left femoral artery and distal anastomosis. May 2015 she underwent balloon angioplasty of the left external and common femoral artery using a 5 mm balloon angioplasty of the distal anastomosis using a 3 mm balloon. She underwent atherectomy and angioplasty of left EIA and CFA on 02/19/14. 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 She has weak feeling in legs after walking about 150 feet, denies no healing wounds.  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   The patient denies New Medical or Surgical History.  Pt Diabetic: Yes, uncontrolled Pt smoker: smoker  (1/2 ppd x many yrs)  Pt meds include: Statin :No, pt states statins cause myalgias Betablocker: Yes ASA: Yes Other anticoagulants/antiplatelets: no  Past Medical History  Diagnosis Date  . PVD (peripheral vascular disease)   . Breast cancer     right breast, chemo and mastectomy  . CAD (coronary artery disease)     severe  . Tobacco abuse   . Diabetes mellitus   . Hyperlipidemia   . Stroke     Social History History  Substance Use Topics  . Smoking status: Current Some Day Smoker -- 0.50 packs/day    Types: Cigarettes  . Smokeless tobacco: Never Used  . Alcohol Use: No    Family History Family History  Problem Relation Age of Onset  . Lung cancer Mother   . Cancer Mother     Lung  . Heart disease Father   . Heart attack Maternal Grandfather   . Cancer Maternal  Grandfather     Multiple Myloma    Past Surgical History  Procedure Laterality Date  . Mastectomy    . Below knee leg amputation      right  . Femoral-popliteal bypass graft    . Tubal ligation    . Cholecystectomy    . Hernia repair    . Pr vein bypass graft,aorto-fem-pop    . Carotid endarterectomy  08/10/2007  . Cataract extraction  04/2012    Left eye  . Lower extremity angiogram Left 06/16/2011    Procedure: LOWER EXTREMITY ANGIOGRAM;  Surgeon: Serafina Mitchell, MD;  Location: [facility] CATH LAB;  Service: Cardiovascular;  Laterality: Left;  . Abdominal aortagram N/A 10/26/2011    Procedure: ABDOMINAL Maxcine Ham;  Surgeon: Serafina Mitchell, MD;  Location: [facility] CATH LAB;  Service: Cardiovascular;  Laterality: N/A;  . Abdominal aortagram N/A 02/19/2014    Procedure: ABDOMINAL AORTAGRAM;  Surgeon: Serafina Mitchell, MD;  Location: [facility] CATH LAB;  Service: Cardiovascular;  Laterality: N/A;    Allergies  Allergen Reactions  . Fenofibrate Shortness Of Breath  . Lidocaine Anaphylaxis  . Procaine Hcl [Procaine Hcl] Anaphylaxis  . Atacand [Candesartan Cilexetil] Other (See Comments)    Severe chest pain  . Codeine Other (See Comments)    Depression; tremors  . Iohexol      Code: HIVES, Desc: pt states that she gets hives from contrast.  stephanie davis, rt-r, Onset Date: [id number]   .  Lyrica [Pregabalin] Other (See Comments)    Chest pain  . Neurontin [Gabapentin] Other (See Comments)    Chest pain  . Other     Weed killer sprays Rubber-smell causes runny eyes and coughing [employer]   . Prednisone     Quits breathing and runs up her blood sugar  . Ramipril Other (See Comments)    Severe cramps  . Soap     Ivory soap  . Lodine [Etodolac] Rash  . Tetracyclines & Related Rash  . Vitamin D Analogs Rash    Current Outpatient Prescriptions  Medication Sig Dispense Refill  . ALPRAZolam (XANAX) 0.25 MG tablet Take 0.125 mg by mouth daily as needed for anxiety.     Darel Hong SOLOSTAR 100 UNIT/ML injection Inject 4-16 Units into the skin See admin instructions. DAILY SLIDING SCALE based on cbg readings    . aspirin EC 81 MG tablet Take 81 mg by mouth daily.    Marland Kitchen atenolol (TENORMIN) 25 MG tablet Take 1 tablet (25 mg total) by mouth daily. 30 tablet 12  . dexlansoprazole (DEXILANT) 60 MG capsule Take 60 mg by mouth daily.    . diphenhydrAMINE (BENADRYL) 25 MG tablet Take 25 mg by mouth every 6 (six) hours as needed for allergies.    Marland Kitchen insulin NPH-insulin regular (HUMULIN 70/30) (70-30) 100 UNIT/ML injection Inject 55-65 Units into the skin 2 (two) times daily with a meal. 55 units in the morning 65 units at bedtime    . ketotifen (ALAWAY) 0.025 % ophthalmic solution Place 1 drop into both eyes 2 (two) times daily as needed. For allergies    . nitroGLYCERIN (NITROSTAT) 0.4 MG SL tablet Place 0.4 mg under the tongue every 5 (five) minutes as needed. For chest pain    . Pediatric Multiple Vit-C-FA (PEDIATRIC MULTIVITAMIN) chewable tablet Chew 1 tablet by mouth daily.     No current facility-administered medications for this visit.    ROS: See HPI for pertinent positives and negatives.   Physical Examination  Filed Vitals:   08/26/14 1514  BP: 113/73  Pulse: 94  Resp: 16  Height: 5\' 9"  (1.753 m)  Weight: 177 lb (80.287 kg)  SpO2: 99%   Body mass index is 26.13 kg/(m^2).  General: A&O x 3, WDWN. Gait: wearing right BKA prosthesis and using cane Eyes: PERRLA. Pulmonary: CTAB, without wheezes , rales or rhonchi, husky voice. Cardiac: regular Rythm , without detected murmur.         Carotid Bruits Right Left   Negative Negative  Aorta is not palpable. Radial pulses: are 1+ palpable and =                           VASCULAR EXAM: Extremities without ischemic changes, without Gangrene; without open wounds. Left 4th toe is surgically absent. Right BKA, wearing prosthesis.  LE Pulses Right Left       FEMORAL  not palpable  faintly palpable        POPLITEAL  not palpable   not palpable       POSTERIOR TIBIAL  BKA palpable   not palpable        DORSALIS PEDIS      ANTERIOR TIBIAL BKA  not palpable    Abdomen: soft, NT, no palpable masses. Skin: no rashes, no ulcers. Musculoskeletal: no muscle wasting or atrophy, see extremities.  Neurologic: A&O X 3; Appropriate Affect ; SENSATION: normal; MOTOR FUNCTION:  moving all extremities equally, motor strength 5/5 throughout. Speech is fluent/normal. CN 2-12 intact.    Non-Invasive Vascular Imaging: DATE: 08/26/2014 LOWER EXTREMITY ARTERIAL DUPLEX EVALUATION    INDICATION: Follow-up left lower extremity bypass graft     PREVIOUS INTERVENTION(S): Left external iliac artery and common femoral artery stent placed 09/08/2010 with angioplasty 10/16/2013 and 02/19/2014 Left femoropopliteal arterial bypass graft placed 12/03/2010 with angioplasty of the distal anastomosis 10/26/2011 and 10/16/2013.     DUPLEX EXAM:     RIGHT  LEFT   Peak Systolic Velocity (cm/s) Ratio (if abnormal) Waveform  Peak Systolic Velocity (cm/s) Ratio (if abnormal) Waveform     Inflow Artery 353614  Stenotic     Proximal Anastomosis 438 2.5 Stenotic     Proximal Graft 99  M     Mid Graft 35  M      Distal Graft 50  M     Distal Anastomosis 45 236 5.2 Stenotic     Outflow Artery 171  M   Today's ABI / TBI 0.61   Previous ABI / TBI (01/21/2014 ) 0.53    Waveform:    M - Monophasic       B - Biphasic       T - Triphasic  If Ankle Brachial Index (ABI) or Toe Brachial Index (TBI) performed, please see complete report     ADDITIONAL FINDINGS: See impression on following page.      IMPRESSION: 1. Patent left lower extremity bypass graft with elevated and stable velocities of the inflow and proximal anastomosis despite recent intervention with mobile thrombus observed at the anastomosis. 2. Distal anastomosis  velocities have slightly improved post-intervention but remain elevated.    Compared to the previous exam:  No significant change compared to prior exam despite intervention.      ASSESSMENT: [name] is a 59 y.o. female who is s/p Left external iliac artery and common femoral artery stent placed 09/08/2010 with angioplasty 10/16/2013 and 02/19/2014, Left femoropopliteal arterial bypass graft placed 12/03/2010 with angioplasty of the distal anastomosis 10/26/2011 and 10/16/2013. She has a right BKA, walks with a prosthesis. She has moderate intermittent claudication in right thigh and left leg, no tissue loss in either lower extremity at this time.  Today's left LE arterial Duplex reveals a patent left lower extremity bypass graft with elevated and stable velocities of the inflow and proximal anastomosis despite recent intervention with mobile thrombus observed at the anastomosis. Distal anastomosis velocities have slightly improved post-intervention but remain elevated. No significant change compared to prior exam despite intervention.   Unfortunately she continues to smoke.  Face to face time with patient was 25 minutes. Over 50% of this time was spent on counseling and coordination of care.   PLAN:  The patient was counseled re smoking cessation and given several free resources re smoking cessation.  I discussed in depth with the patient the  nature of atherosclerosis, and emphasized the importance of maximal medical management including strict control of blood pressure, blood glucose, and lipid levels, obtaining regular exercise, and cessation of smoking.  The patient is aware that without maximal medical management the underlying atherosclerotic disease process will progress, limiting the benefit of any interventions.  Graduated walking program. Based on the patient's vascular studies and examination, and after discussing with Dr. Trula Slade, pt will return to clinic in 3 months with left LE  arterial Duplex and left ABI, see Dr. Trula Slade.  The patient was given information about PAD including signs, symptoms, treatment, what symptoms should prompt the patient to seek immediate medical care, and risk reduction measures to take.  Clemon Chambers, RN, MSN, FNP-C [facility] Phone: [phone]  Clinic MD: Trula Slade  08/26/2014 3:51 PM

## 2014-08-27 NOTE — Addendum Note (Signed)
Addended by: Mena Goes on: 08/27/2014 11:15 AM   Modules accepted: Orders

## 2014-11-13 ENCOUNTER — Encounter (HOSPITAL_COMMUNITY): Payer: Self-pay | Admitting: *Deleted

## 2014-11-13 ENCOUNTER — Emergency Department (HOSPITAL_COMMUNITY)
Admission: EM | Admit: 2014-11-13 | Discharge: 2014-11-13 | Disposition: A | Discharge status: Home / Self Care | Payer: Medicare Other | Attending: Emergency Medicine | Admitting: Emergency Medicine

## 2014-11-13 DIAGNOSIS — Z951 Presence of aortocoronary bypass graft: Secondary | ICD-10-CM | POA: Diagnosis not present

## 2014-11-13 DIAGNOSIS — Z853 Personal history of malignant neoplasm of breast: Secondary | ICD-10-CM | POA: Diagnosis not present

## 2014-11-13 DIAGNOSIS — Z720 Tobacco use: Secondary | ICD-10-CM | POA: Diagnosis not present

## 2014-11-13 DIAGNOSIS — N950 Postmenopausal bleeding: Secondary | ICD-10-CM

## 2014-11-13 DIAGNOSIS — N938 Other specified abnormal uterine and vaginal bleeding: Secondary | ICD-10-CM | POA: Diagnosis present

## 2014-11-13 DIAGNOSIS — I2510 Atherosclerotic heart disease of native coronary artery without angina pectoris: Secondary | ICD-10-CM | POA: Insufficient documentation

## 2014-11-13 DIAGNOSIS — Z79899 Other long term (current) drug therapy: Secondary | ICD-10-CM | POA: Insufficient documentation

## 2014-11-13 DIAGNOSIS — Z7982 Long term (current) use of aspirin: Secondary | ICD-10-CM | POA: Insufficient documentation

## 2014-11-13 DIAGNOSIS — E119 Type 2 diabetes mellitus without complications: Secondary | ICD-10-CM | POA: Diagnosis not present

## 2014-11-13 DIAGNOSIS — Z8673 Personal history of transient ischemic attack (TIA), and cerebral infarction without residual deficits: Secondary | ICD-10-CM | POA: Insufficient documentation

## 2014-11-13 DIAGNOSIS — Z794 Long term (current) use of insulin: Secondary | ICD-10-CM | POA: Diagnosis not present

## 2014-11-13 NOTE — ED Notes (Signed)
Pt reports vaginal bleeding starting today. Pt states that she has not had a period in 12 years. Pt denies any blood thinners. Pt states that she noticed the bleeding when she wipes.

## 2014-11-13 NOTE — ED Notes (Signed)
PA at bedside.

## 2014-11-13 NOTE — ED Provider Notes (Signed)
CSN: [id number]     Arrival date & time 11/13/14  1546 History   First MD Initiated Contact with Patient 11/13/14 1752     Chief Complaint  Patient presents with  . Vaginal Bleeding     (Consider location/radiation/quality/duration/timing/severity/associated sxs/prior Treatment) HPI   [name] is a 59 y.o. female past medical history significant for CAD, PVD (right BKA), tobacco use, insulin-dependent diabetes, CVA, status post remote breast cancer complaining of vaginal bleeding onset this morning. Patient has been postmenopausal for 20 years. States that the flow is scant, she sure that this is coming from the vagina, patient is a tampon it was bloody. She denies easy bruising or bleeding, night sweats, abdominal pain. On review of systems she notes a 4 pound unintentional weight loss over the last 4 weeks. She does not have a OB/GYN. She denies anticoagulation, takes a low-dose daily aspirin.  Past Medical History  Diagnosis Date  . PVD (peripheral vascular disease)   . Breast cancer     right breast, chemo and mastectomy  . CAD (coronary artery disease)     severe  . Tobacco abuse   . Diabetes mellitus   . Hyperlipidemia   . Stroke    Past Surgical History  Procedure Laterality Date  . Mastectomy    . Below knee leg amputation      right  . Femoral-popliteal bypass graft    . Tubal ligation    . Cholecystectomy    . Hernia repair    . Pr vein bypass graft,aorto-fem-pop    . Carotid endarterectomy  08/10/2007  . Cataract extraction  04/2012    Left eye  . Lower extremity angiogram Left 06/16/2011    Procedure: LOWER EXTREMITY ANGIOGRAM;  Surgeon: Serafina Mitchell, MD;  Location: [facility] CATH LAB;  Service: Cardiovascular;  Laterality: Left;  . Abdominal aortagram N/A 10/26/2011    Procedure: ABDOMINAL Maxcine Ham;  Surgeon: Serafina Mitchell, MD;  Location: [facility] CATH LAB;  Service: Cardiovascular;  Laterality: N/A;  . Abdominal aortagram N/A 02/19/2014    Procedure: ABDOMINAL  AORTAGRAM;  Surgeon: Serafina Mitchell, MD;  Location: [facility] CATH LAB;  Service: Cardiovascular;  Laterality: N/A;   Family History  Problem Relation Age of Onset  . Lung cancer Mother   . Cancer Mother     Lung  . Heart disease Father   . Heart attack Maternal Grandfather   . Cancer Maternal Grandfather     Multiple Myloma   History  Substance Use Topics  . Smoking status: Current Some Day Smoker -- 0.50 packs/day    Types: Cigarettes  . Smokeless tobacco: Never Used  . Alcohol Use: No   OB History    No data available     Review of Systems  10 systems reviewed and found to be negative, except as noted in the HPI.   Allergies  Fenofibrate; Lidocaine; Procaine hcl; Atacand; Codeine; Iohexol; Lyrica; Neurontin; Other; Prednisone; Ramipril; Soap; Lodine; Tetracyclines & related; and Vitamin d analogs  Home Medications   Prior to Admission medications   Medication Sig Start Date End Date Taking? Authorizing Provider  ALPRAZolam (XANAX) 0.25 MG tablet Take 0.125 mg by mouth daily as needed for anxiety.   Yes Historical Provider, MD  APIDRA SOLOSTAR 100 UNIT/ML injection Inject 4-16 Units into the skin See admin instructions. DAILY SLIDING SCALE based on cbg readings 10/21/10  Yes Historical Provider, MD  aspirin EC 81 MG tablet Take 81 mg by mouth daily.   Yes Historical  Provider, MD  diphenhydrAMINE (BENADRYL) 25 MG tablet Take 25 mg by mouth every 6 (six) hours as needed for allergies.   Yes Historical Provider, MD  insulin NPH-insulin regular (HUMULIN 70/30) (70-30) 100 UNIT/ML injection Inject 55-65 Units into the skin 2 (two) times daily with a meal. 55 units in the morning 65 units at bedtime   Yes Historical Provider, MD  ketotifen (ALAWAY) 0.025 % ophthalmic solution Place 1 drop into both eyes 2 (two) times daily as needed. For allergies   Yes Historical Provider, MD  nitroGLYCERIN (NITROSTAT) 0.4 MG SL tablet Place 0.4 mg under the tongue every 5 (five) minutes as needed. For  chest pain   Yes Historical Provider, MD  atenolol (TENORMIN) 25 MG tablet Take 1 tablet (25 mg total) by mouth daily. 07/06/12 10/02/13  Josue Hector, MD   BP 148/51 mmHg  Pulse 89  Temp(Src) 98.5 F (36.9 C) (Oral)  Resp 16  Ht 5\' 9"  (1.753 m)  Wt 177 lb (80.287 kg)  BMI 26.13 kg/m2  SpO2 100% Physical Exam  Constitutional: She is oriented to person, place, and time. She appears well-developed and well-nourished. No distress.  HENT:  Head: Normocephalic and atraumatic.  Mouth/Throat: Oropharynx is clear and moist.  Eyes: Conjunctivae and EOM are normal. Pupils are equal, round, and reactive to light.  Neck: Normal range of motion.  Cardiovascular: Normal rate, regular rhythm and intact distal pulses.   Pulmonary/Chest: Effort normal and breath sounds normal. No respiratory distress. She has no wheezes. She has no rales.  Decreased air movement at the bilateral bases, no wheezing  Abdominal: Soft. There is no tenderness.  Musculoskeletal: Normal range of motion.  Right BKA  Neurological: She is alert and oriented to person, place, and time.  Skin: She is not diaphoretic.  Psychiatric: She has a normal mood and affect.  Nursing note and vitals reviewed.   ED Course  Procedures (including critical care time) Labs Review Labs Reviewed - No data to display  Imaging Review No results found.   EKG Interpretation None      MDM   Final diagnoses:  Post-menopausal bleeding    Filed Vitals:   11/13/14 1552 11/13/14 1825  BP: 122/62 148/51  Pulse: 107 89  Temp: 98.5 F (36.9 C)   TempSrc: Oral   Resp: 16 16  Height: 5\' 9"  (1.753 m)   Weight: 177 lb (80.287 kg)   SpO2: 98% 100%   [name] is a pleasant 59 y.o. female presenting with postmenopausal bleeding. Stable vital signs, patient is not anticoagulated she is in no pain. Does not have an OB/GYN. Called over to [facility] hospital and discussed the case with the nurse midwife, she will expedite an appointment  for a endometrial biopsy for this patient.  This is a shared visit with the attending physician who personally evaluated the patient and agrees with the care plan.   Evaluation does not show pathology that would require ongoing emergent intervention or inpatient treatment. Pt is hemodynamically stable and mentating appropriately. Discussed findings and plan with patient/guardian, who agrees with care plan. All questions answered. Return precautions discussed and outpatient follow up given.    Monico Blitz, PA-C 11/13/14 [place], MD 11/14/14 [phone]

## 2014-11-13 NOTE — Discharge Instructions (Signed)
Please follow with your primary care doctor in the next 2 days for a check-up. They must obtain records for further management.  ° °Do not hesitate to return to the Emergency Department for any new, worsening or concerning symptoms.  ° °

## 2014-11-13 NOTE — ED Provider Notes (Signed)
  Face-to-face evaluation   History: She complains of some minimal vaginal spotting which started today. She had a D&C remotely about 20 years ago. She takes aspirin currently and no anticoagulants.  Physical exam: Alert, calm, cooperative. No respiratory distress.  Medical screening examination/treatment/procedure(s) were conducted as a shared visit with non-physician practitioner(s) and myself.  I personally evaluated the patient during the encounter  Daleen Bo, MD 11/14/14 [phone]

## 2014-11-26 ENCOUNTER — Encounter: Payer: Self-pay | Admitting: Surgery

## 2014-11-28 ENCOUNTER — Encounter: Payer: Self-pay | Admitting: Obstetrics and Gynecology

## 2014-11-28 ENCOUNTER — Ambulatory Visit (INDEPENDENT_AMBULATORY_CARE_PROVIDER_SITE_OTHER): Payer: Medicare Other | Admitting: Obstetrics and Gynecology

## 2014-11-28 ENCOUNTER — Other Ambulatory Visit (HOSPITAL_COMMUNITY)
Admission: RE | Admit: 2014-11-28 | Discharge: 2014-11-28 | Disposition: A | Discharge status: Home / Self Care | Payer: Medicare Other | Source: Ambulatory Visit | Attending: Obstetrics and Gynecology | Admitting: Obstetrics and Gynecology

## 2014-11-28 VITALS — BP 127/51 | HR 91 | Ht 69.0 in | Wt 172.4 lb

## 2014-11-28 DIAGNOSIS — Z1151 Encounter for screening for human papillomavirus (HPV): Secondary | ICD-10-CM

## 2014-11-28 DIAGNOSIS — N950 Postmenopausal bleeding: Secondary | ICD-10-CM | POA: Diagnosis present

## 2014-11-28 DIAGNOSIS — Z124 Encounter for screening for malignant neoplasm of cervix: Secondary | ICD-10-CM | POA: Diagnosis present

## 2014-11-28 DIAGNOSIS — Z01419 Encounter for gynecological examination (general) (routine) without abnormal findings: Secondary | ICD-10-CM | POA: Insufficient documentation

## 2014-11-28 NOTE — Progress Notes (Signed)
Patient ID: [name], female   DOB: 1955-09-22, 59 y.o.   MRN: [id number] 59 yo postmenopausal for 20 years as a result of chemo/radiation treatment for breast cancer presenting today for the evaluation of postmenopausal vaginal bleeding. The patient was seen in the emergency room on 11/13/2014 when it started. She described it at scant and noticed it on toilet paper when she wiped. 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 She reports having had intercourse a few days prior to the event as well. She denies any abdominal/pelvic pain or any further episodes of vaginal bleeding since that day.  Past Medical History  Diagnosis Date  . PVD (peripheral vascular disease)   . Breast cancer     right breast, chemo and mastectomy  . CAD (coronary artery disease)     severe  . Tobacco abuse   . Diabetes mellitus   . Hyperlipidemia   . Stroke    Past Surgical History  Procedure Laterality Date  . Mastectomy    . Below knee leg amputation      right  . Femoral-popliteal bypass graft    . Tubal ligation    . Cholecystectomy    . Hernia repair    . Pr vein bypass graft,aorto-fem-pop    . Carotid endarterectomy  08/10/2007  . Cataract extraction  04/2012    Left eye  . Lower extremity angiogram Left 06/16/2011    Procedure: LOWER EXTREMITY ANGIOGRAM;  Surgeon: Serafina Mitchell, MD;  Location: [facility] CATH LAB;  Service: Cardiovascular;  Laterality: Left;  . Abdominal aortagram N/A 10/26/2011    Procedure: ABDOMINAL Maxcine Ham;  Surgeon: Serafina Mitchell, MD;  Location: [facility] CATH LAB;  Service: Cardiovascular;  Laterality: N/A;  . Abdominal aortagram N/A 02/19/2014    Procedure: ABDOMINAL AORTAGRAM;  Surgeon: Serafina Mitchell, MD;  Location: [facility] CATH LAB;  Service: Cardiovascular;  Laterality: N/A;   Family History  Problem Relation Age of Onset  . Lung cancer Mother   . Cancer Mother     Lung  . Heart disease Father   . Heart attack Maternal Grandfather   . Cancer Maternal Grandfather     Multiple Myloma    History  Substance Use Topics  . Smoking status: Current Some Day Smoker -- 0.50 packs/day    Types: Cigarettes  . Smokeless tobacco: Never Used  . Alcohol Use: No   ROS See pertinent in HPI  GENERAL: Well-developed, well-nourished female in no acute distress.  ABDOMEN: Soft, nontender, nondistended. No organomegaly. PELVIC: Normal external female genitalia. Vagina is atrophic.  Normal discharge. Normal atrophic cervix. Uterus is normal in size. No adnexal mass or tenderness. EXTREMITIES: No cyanosis, clubbing, or edema, 2+ distal pulses.  A/P 59 yo with an episode of postmenopausal vaginal bleeding on 11/13/2014 - Pelvic ultrasound ordered - Patient will be contacted with any abnormal results and appropriate management - Discussed the potential need for endometrial biopsy pending ultrasound results and definitely if vaginal bleeding returns - rtc prn

## 2014-11-29 LAB — CYTOLOGY - PAP

## 2014-12-02 ENCOUNTER — Encounter (HOSPITAL_COMMUNITY): Payer: Medicare Other

## 2014-12-02 ENCOUNTER — Ambulatory Visit: Payer: Medicare Other | Admitting: Surgery

## 2014-12-02 ENCOUNTER — Other Ambulatory Visit (HOSPITAL_COMMUNITY): Payer: Medicare Other

## 2014-12-04 ENCOUNTER — Telehealth: Payer: Self-pay

## 2014-12-04 ENCOUNTER — Ambulatory Visit (HOSPITAL_COMMUNITY)
Admission: RE | Admit: 2014-12-04 | Discharge: 2014-12-04 | Disposition: A | Discharge status: Home / Self Care | Payer: Medicare Other | Source: Ambulatory Visit | Attending: Obstetrics and Gynecology | Admitting: Obstetrics and Gynecology

## 2014-12-04 DIAGNOSIS — N950 Postmenopausal bleeding: Secondary | ICD-10-CM | POA: Insufficient documentation

## 2014-12-04 MED ORDER — MISOPROSTOL 200 MCG PO TABS: ORAL_TABLET | ORAL | Status: DC

## 2014-12-04 MED ORDER — MISOPROSTOL 200 MCG PO TABS: 800.0000 ug | ORAL_TABLET | Freq: Once | ORAL | Status: DC

## 2014-12-04 NOTE — Telephone Encounter (Signed)
-----   Message from Mora Bellman, MD sent at 12/04/2014  9:38 AM EDT ----- Please inform patient of abnormal ultrasound finding of a focal lesion in the uterus. I would recommend she comes in for endometrial biopsy. Please inform patient to place cytotec (e-prescribed) in the vagina the night before her appointment to facilitate the endometrial biospy

## 2014-12-04 NOTE — Telephone Encounter (Signed)
Called patient and informed her of results and recommendations. Discussed cytotec and how to use it the night prior to exam-- medication e-prescribed as it had orginally been prescribed for in clinic. Informed patient she will be contacted with appointment by front office staff within the next couple of days and advised she call if she has not heard by Friday. Patient verbalized understanding and gratitude. No questions or concerns.

## 2014-12-10 ENCOUNTER — Telehealth: Payer: Self-pay | Admitting: General Practice

## 2014-12-10 NOTE — Telephone Encounter (Signed)
Patient called and left message stating she is expecting a call about scheduling an appt for a biopsy. Patient states she hasn't heard anything yet. Called patient and apologized for inconvenience and informed her that appt had already been scheduled for 7/22 @ 830. Patient verbalized understanding and asked about pap results. Informed patient of normal pap results. Patient verbalized understanding and had no other questions

## 2015-01-03 ENCOUNTER — Ambulatory Visit: Payer: Medicare Other | Admitting: Obstetrics and Gynecology

## 2015-02-06 ENCOUNTER — Ambulatory Visit: Payer: 59 | Admitting: Obstetrics and Gynecology

## 2015-02-27 ENCOUNTER — Ambulatory Visit (INDEPENDENT_AMBULATORY_CARE_PROVIDER_SITE_OTHER): Payer: Medicare Other | Admitting: Pulmonary Disease

## 2015-02-27 ENCOUNTER — Encounter: Payer: Self-pay | Admitting: Pulmonary Disease

## 2015-02-27 VITALS — BP 122/58 | HR 106 | Temp 98.3°F | Ht 69.0 in | Wt 154.6 lb

## 2015-02-27 DIAGNOSIS — I25118 Atherosclerotic heart disease of native coronary artery with other forms of angina pectoris: Secondary | ICD-10-CM

## 2015-02-27 DIAGNOSIS — I70219 Atherosclerosis of native arteries of extremities with intermittent claudication, unspecified extremity: Secondary | ICD-10-CM

## 2015-02-27 DIAGNOSIS — J189 Pneumonia, unspecified organism: Secondary | ICD-10-CM | POA: Diagnosis not present

## 2015-02-27 DIAGNOSIS — R918 Other nonspecific abnormal finding of lung field: Secondary | ICD-10-CM

## 2015-02-27 DIAGNOSIS — Z720 Tobacco use: Secondary | ICD-10-CM

## 2015-02-27 DIAGNOSIS — J9811 Atelectasis: Secondary | ICD-10-CM

## 2015-02-27 DIAGNOSIS — F17210 Nicotine dependence, cigarettes, uncomplicated: Secondary | ICD-10-CM

## 2015-02-27 DIAGNOSIS — J449 Chronic obstructive pulmonary disease, unspecified: Secondary | ICD-10-CM | POA: Diagnosis not present
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
## 2015-02-27 NOTE — Patient Instructions (Signed)
Monick-- it was nice meeting you today...  Today we did a pulmonary function test... We will schedule a PET scan of your lungs...    I will call you w/ the results as soon as they are avail...  We will then decide on our next step- likely a bronchoscopy yo check your bronchial tubes and biopsy the RML area...  Call for any questions.Marland KitchenMarland Kitchen

## 2015-03-10 ENCOUNTER — Ambulatory Visit (HOSPITAL_COMMUNITY)
Admission: RE | Admit: 2015-03-10 | Discharge: 2015-03-10 | Disposition: A | Discharge status: Home / Self Care | Payer: Medicare Other | Source: Ambulatory Visit | Attending: Pulmonary Disease | Admitting: Pulmonary Disease

## 2015-03-10 DIAGNOSIS — I700 Atherosclerosis of aorta: Secondary | ICD-10-CM | POA: Insufficient documentation

## 2015-03-10 DIAGNOSIS — Z9011 Acquired absence of right breast and nipple: Secondary | ICD-10-CM | POA: Insufficient documentation

## 2015-03-10 DIAGNOSIS — R918 Other nonspecific abnormal finding of lung field: Secondary | ICD-10-CM

## 2015-03-10 DIAGNOSIS — I2510 Atherosclerotic heart disease of native coronary artery without angina pectoris: Secondary | ICD-10-CM | POA: Diagnosis not present

## 2015-03-10 DIAGNOSIS — C778 Secondary and unspecified malignant neoplasm of lymph nodes of multiple regions: Secondary | ICD-10-CM | POA: Diagnosis not present

## 2015-03-10 DIAGNOSIS — R229 Localized swelling, mass and lump, unspecified: Secondary | ICD-10-CM | POA: Diagnosis not present

## 2015-03-10 DIAGNOSIS — J189 Pneumonia, unspecified organism: Secondary | ICD-10-CM | POA: Diagnosis not present

## 2015-03-10 DIAGNOSIS — Z853 Personal history of malignant neoplasm of breast: Secondary | ICD-10-CM | POA: Insufficient documentation

## 2015-03-10 DIAGNOSIS — C7972 Secondary malignant neoplasm of left adrenal gland: Secondary | ICD-10-CM | POA: Insufficient documentation

## 2015-03-10 DIAGNOSIS — C7951 Secondary malignant neoplasm of bone: Secondary | ICD-10-CM | POA: Insufficient documentation

## 2015-03-10 LAB — GLUCOSE, CAPILLARY: Glucose-Capillary: 195 mg/dL — ABNORMAL HIGH (ref 65–99)

## 2015-03-10 MED ORDER — FLUDEOXYGLUCOSE F - 18 (FDG) INJECTION
7.6000 | Freq: Once | INTRAVENOUS | Status: DC | PRN
  Administered 2015-03-10: 7.6 via INTRAVENOUS
  Filled 2015-03-10: qty 7.6

## 2015-03-11 ENCOUNTER — Telehealth: Payer: Self-pay | Admitting: Emergency Medicine

## 2015-03-11 NOTE — Telephone Encounter (Signed)
Spoke with pt and advised of Dr Agustina Caroli  Instructions.  Pt verbalized understanding.  Nothing further needed.

## 2015-03-11 NOTE — Telephone Encounter (Signed)
This is Dr Jeannine Kitten patient who needs FOB.  Please call pt and let her know that I have set up for 10:00 Thursday 03/13/15 at [facility].  Needs to show up 45 minutes early. She will also be contacted by Respiratory with more details Have her stop her ASA until after the procedure.

## 2015-03-13 ENCOUNTER — Telehealth: Payer: Self-pay | Admitting: Pulmonary Disease

## 2015-03-13 ENCOUNTER — Ambulatory Visit (HOSPITAL_COMMUNITY): Admission: RE | Admit: 2015-03-13 | Payer: Medicare Other | Source: Ambulatory Visit | Admitting: Emergency Medicine

## 2015-03-13 ENCOUNTER — Encounter (HOSPITAL_COMMUNITY): Admission: RE | Payer: Self-pay | Source: Ambulatory Visit

## 2015-03-13 ENCOUNTER — Inpatient Hospital Stay (HOSPITAL_COMMUNITY)
Admission: RE | Admit: 2015-03-13 | Discharge: 2015-03-13 | Disposition: A | Discharge status: Home / Self Care | Payer: Medicare Other | Source: Ambulatory Visit

## 2015-03-13 SURGERY — BRONCHOSCOPY, WITH FLUOROSCOPY
Anesthesia: Moderate Sedation | Laterality: Bilateral

## 2015-03-13 NOTE — Telephone Encounter (Signed)
Called spoke with daughter. She reports pt has had some nausea, vomiting. She has not called PCP to get something called in. I advised since SN is not in this week, to give pt PCP a call. She will do so. Nothing further needed

## 2015-03-19 ENCOUNTER — Telehealth: Payer: Self-pay | Admitting: Pulmonary Disease

## 2015-03-19 NOTE — Telephone Encounter (Signed)
Patient hurting very bad from chest down to pelvis.  Cannot eat, very nauseated.  Wheezing really bad.  Pain every where.  Patient was to have Bronchoscopy done but could not do it because she is allergic to Lidocaine.    Allergies  Allergen Reactions  . Fenofibrate Shortness Of Breath  . Lidocaine Anaphylaxis  . Procaine Hcl [Procaine Hcl] Anaphylaxis  . Atacand [Candesartan Cilexetil] Other (See Comments)    Severe chest pain  . Codeine Other (See Comments)    Depression; tremors  . Iohexol      Code: HIVES, Desc: pt states that she gets hives from contrast.  stephanie davis, rt-r, Onset Date: [id number]   . Lyrica [Pregabalin] Other (See Comments)    Chest pain  . Neurontin [Gabapentin] Other (See Comments)    Chest pain  . Other     Weed killer sprays Rubber-smell causes runny eyes and coughing [employer]   . Prednisone     Quits breathing and runs up her blood sugar  . Ramipril Other (See Comments)    Severe cramps  . Soap     Ivory soap  . Lodine [Etodolac] Rash  . Tetracyclines & Related Rash  . Vitamin D Analogs Rash

## 2015-03-20 ENCOUNTER — Emergency Department (HOSPITAL_COMMUNITY)
Admission: EM | Admit: 2015-03-20 | Discharge: 2015-03-20 | Disposition: A | Discharge status: Home / Self Care | Payer: Medicare Other | Attending: Emergency Medicine | Admitting: Emergency Medicine

## 2015-03-20 ENCOUNTER — Encounter (HOSPITAL_COMMUNITY): Payer: Self-pay | Admitting: Emergency Medicine

## 2015-03-20 DIAGNOSIS — R52 Pain, unspecified: Secondary | ICD-10-CM | POA: Diagnosis not present

## 2015-03-20 DIAGNOSIS — E119 Type 2 diabetes mellitus without complications: Secondary | ICD-10-CM | POA: Insufficient documentation

## 2015-03-20 DIAGNOSIS — Z79899 Other long term (current) drug therapy: Secondary | ICD-10-CM | POA: Insufficient documentation

## 2015-03-20 DIAGNOSIS — Z720 Tobacco use: Secondary | ICD-10-CM | POA: Diagnosis not present

## 2015-03-20 DIAGNOSIS — Z89511 Acquired absence of right leg below knee: Secondary | ICD-10-CM | POA: Insufficient documentation

## 2015-03-20 DIAGNOSIS — R1110 Vomiting, unspecified: Secondary | ICD-10-CM | POA: Insufficient documentation

## 2015-03-20 DIAGNOSIS — Z8673 Personal history of transient ischemic attack (TIA), and cerebral infarction without residual deficits: Secondary | ICD-10-CM | POA: Diagnosis not present

## 2015-03-20 DIAGNOSIS — Z853 Personal history of malignant neoplasm of breast: Secondary | ICD-10-CM | POA: Diagnosis not present

## 2015-03-20 DIAGNOSIS — I2510 Atherosclerotic heart disease of native coronary artery without angina pectoris: Secondary | ICD-10-CM | POA: Insufficient documentation

## 2015-03-20 DIAGNOSIS — R918 Other nonspecific abnormal finding of lung field: Secondary | ICD-10-CM | POA: Diagnosis not present

## 2015-03-20 DIAGNOSIS — R1111 Vomiting without nausea: Secondary | ICD-10-CM

## 2015-03-20 DIAGNOSIS — Z7982 Long term (current) use of aspirin: Secondary | ICD-10-CM | POA: Diagnosis not present

## 2015-03-20 DIAGNOSIS — Z794 Long term (current) use of insulin: Secondary | ICD-10-CM | POA: Insufficient documentation

## 2015-03-20 LAB — COMPREHENSIVE METABOLIC PANEL
ALBUMIN: 3.5 g/dL (ref 3.5–5.0)
ALK PHOS: 138 U/L — AB (ref 38–126)
ALT: 11 U/L — ABNORMAL LOW (ref 14–54)
AST: 23 U/L (ref 15–41)
Anion gap: 9 (ref 5–15)
BILIRUBIN TOTAL: 1.4 mg/dL — AB (ref 0.3–1.2)
BUN: 12 mg/dL (ref 6–20)
CO2: 28 mmol/L (ref 22–32)
Calcium: 10 mg/dL (ref 8.9–10.3)
Chloride: 95 mmol/L — ABNORMAL LOW (ref 101–111)
Creatinine, Ser: 0.56 mg/dL (ref 0.44–1.00)
GFR calc Af Amer: >60 mL/min (ref >60)
GFR calc non Af Amer: >60 mL/min (ref >60)
GLUCOSE: 367 mg/dL — AB (ref 65–99)
POTASSIUM: 5 mmol/L (ref 3.5–5.1)
SODIUM: 132 mmol/L — AB (ref 135–145)
TOTAL PROTEIN: 7.2 g/dL (ref 6.5–8.1)

## 2015-03-20 LAB — CBC
HEMATOCRIT: 40.8 % (ref 36.0–46.0)
HEMOGLOBIN: 13.7 g/dL (ref 12.0–15.0)
MCH: 28.4 pg (ref 26.0–34.0)
MCHC: 33.6 g/dL (ref 30.0–36.0)
MCV: 84.5 fL (ref 78.0–100.0)
Platelets: 340 10*3/uL (ref 150–400)
RBC: 4.83 MIL/uL (ref 3.87–5.11)
RDW: 13.8 % (ref 11.5–15.5)
WBC: 9.4 10*3/uL (ref 4.0–10.5)

## 2015-03-20 LAB — LIPASE, BLOOD: Lipase: 18 U/L — ABNORMAL LOW (ref 22–51)

## 2015-03-20 LAB — I-STAT TROPONIN, ED: Troponin i, poc: 0.02 ng/mL (ref 0.00–0.08)

## 2015-03-20 MED ORDER — ONDANSETRON HCL 4 MG/2ML IJ SOLN
4.0000 mg | Freq: Once | INTRAMUSCULAR | Status: AC | PRN
  Administered 2015-03-20: 4 mg via INTRAVENOUS
  Filled 2015-03-20: qty 2

## 2015-03-20 MED ORDER — ONDANSETRON HCL 4 MG/2ML IJ SOLN
4.0000 mg | Freq: Once | INTRAMUSCULAR | Status: AC
  Administered 2015-03-20: 4 mg via INTRAVENOUS
  Filled 2015-03-20: qty 2

## 2015-03-20 MED ORDER — MORPHINE SULFATE (PF) 2 MG/ML IV SOLN
2.0000 mg | Freq: Once | INTRAVENOUS | Status: AC
  Administered 2015-03-20: 2 mg via INTRAVENOUS

## 2015-03-20 MED ORDER — MORPHINE SULFATE (PF) 4 MG/ML IV SOLN
4.0000 mg | Freq: Once | INTRAVENOUS | Status: AC
  Administered 2015-03-20: 2 mg via INTRAVENOUS
  Filled 2015-03-20: qty 1

## 2015-03-20 MED ORDER — ONDANSETRON 4 MG PO TBDP: 4.0000 mg | ORAL_TABLET | Freq: Three times a day (TID) | ORAL | Status: AC | PRN

## 2015-03-20 NOTE — Telephone Encounter (Signed)
Per SN, he has spoken with pt directly and this issue has been handled.  Nothing further needed.

## 2015-03-20 NOTE — Consult Note (Signed)
Name: [name] MRN: [id number] DOB: 1955/06/26    ADMISSION DATE:  03/20/2015 CONSULTATION DATE:  03/20/15  REFERRING MD :  EDP  CHIEF COMPLAINT:  Lung mass, n/v   BRIEF PATIENT DESCRIPTION:  59yo female smoker with hx COPD, CAD, severe PVD s/p R BKA, hx breast ca, DM, known R lung mass.  Recent PET scan showed hypermetabolic central R lung mass with hilar and subcarinal adenopathy as well as 2.5cm L adrenal mass and multiple other areas including T-spine.  She was scheduled for outpt FOB but this was canceled r/t a lidocaine allergy as well as worsening nausea and pain.  She ultimately presented to ER 10/6 with worsening pain and n/v.    SIGNIFICANT EVENTS    STUDIES:  PET 9/26>>> 1. Infiltrative hypermetabolic 3.5 x 2.4 cm central right lung mass centered in the central right middle lobe, which narrows the right middle lobe bronchus and is suspicious for a primary lung malignancy. Persistent postobstructive pneumonia and atelectasis in the right middle lobe. 2. Central right lung mass is confluent with hypermetabolic metastatic right hilar, subcarinal and right paratracheal lymphadenopathy, which involves the carina. 3. Hypermetabolic right supraclavicular metastatic lymph node. 4. Subsolid bilateral upper lobe subcentimeter pulmonary nodules, below PET resolution, cannot exclude pulmonary metastases. 5. Hypermetabolic left adrenal metastasis. 6. 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 7. Nonspecific hypermetabolic small subcutaneous nodule in the right posterior chest wall, possibly a superficial metastasis. 8. Nonspecific hypermetabolic right upper uterine focus without discrete mass on the CT, possibly a fibroid, cannot exclude an endometrial mass. Correlate with pelvic sonography as clinically warranted. 9. Atherosclerosis, including three-vessel coronary artery disease. Please note that although the  presence of coronary artery calcium documents the presence of coronary artery disease, the severity of this disease and any potential stenosis cannot be assessed on this non-gated CT examination.   HISTORY OF PRESENT ILLNESS:  59yo female smoker with hx COPD, CAD, severe PVD s/p R BKA, hx breast ca, DM, known R lung mass.  Recent PET scan showed hypermetabolic central R lung mass with hilar and subcarinal adenopathy as well as 2.5cm L adrenal mass and multiple other areas including T-spine.  She was scheduled for outpt FOB but this was canceled r/t a lidocaine allergy as well as worsening nausea and pain.  She ultimately presented to ER 10/6 with worsening pain and n/v.     C/o multiple areas of pain - back, R hip, L flank, R pleuritic chest pain.  Gradual weight loss over last several weeks with malaise and anorexia/nausea over last 2 weeks.  Denies chest pain, hemoptysis, purulent sputum, fever.    PAST MEDICAL HISTORY :   has a past medical history of PVD (peripheral vascular disease) ([place]); Breast cancer ([place]); CAD (coronary artery disease); Tobacco abuse; Diabetes mellitus; Hyperlipidemia; and Stroke ([place]).  has past surgical history that includes Mastectomy; Below knee leg amputation; Femoral-popliteal Bypass Graft; Tubal ligation; Cholecystectomy; Hernia repair; vein bypass graft,aorto-fem-pop; Carotid endarterectomy (08/10/2007); Cataract extraction (04/2012); lower extremity angiogram (Left, 06/16/2011); abdominal aortagram (N/A, 10/26/2011); and abdominal aortagram (N/A, 02/19/2014). Prior to Admission medications   Medication Sig Start Date End Date Taking? Authorizing Delorice Bannister  acetaminophen (TYLENOL) 500 MG tablet Take 500 mg by mouth every 6 (six) hours as needed for mild pain.   Yes Historical Alekhya Gravlin, MD  aspirin EC 81 MG tablet Take 81 mg by mouth daily.   Yes Historical Roshun Klingensmith, MD  atenolol (TENORMIN) 25 MG tablet 25 mg  daily. 01/14/15  Yes Historical Leontine Radman, MD  bismuth  subsalicylate (PEPTO BISMOL) 262 MG/15ML suspension Take 30 mLs by mouth every 6 (six) hours as needed for indigestion.   Yes Historical Acelin Ferdig, MD  diphenhydrAMINE (BENADRYL) 25 MG tablet Take 25 mg by mouth every 6 (six) hours as needed for allergies.   Yes Historical Caden Fatica, MD  insulin NPH-insulin regular (HUMULIN 70/30) (70-30) 100 UNIT/ML injection Inject 55-65 Units into the skin 2 (two) times daily with a meal. 55 units in the morning 65 units at bedtime   Yes Historical Caileigh Canche, MD  ondansetron (ZOFRAN-ODT) 8 MG disintegrating tablet Take 8 mg by mouth every 8 (eight) hours as needed for nausea or vomiting.   Yes Historical Najla Aughenbaugh, MD  PROAIR HFA 108 (90 BASE) MCG/ACT inhaler 2 puffs every 4 (four) hours as needed. 01/14/15  Yes Historical Shannen Flansburg, MD  traMADol (ULTRAM) 50 MG tablet Take 50 mg by mouth every 6 (six) hours as needed.   Yes Historical Zaelyn Noack, MD  ALPRAZolam Duanne Moron) 0.25 MG tablet Take 0.125 mg by mouth daily as needed for anxiety.    Historical Ashiya Kinkead, MD  APIDRA SOLOSTAR 100 UNIT/ML injection Inject 4-16 Units into the skin See admin instructions. DAILY SLIDING SCALE based on cbg readings 10/21/10   Historical Akeia Perot, MD  atenolol (TENORMIN) 25 MG tablet Take 1 tablet (25 mg total) by mouth daily. 07/06/12 11/28/14  Josue Hector, MD  ketotifen (ALAWAY) 0.025 % ophthalmic solution Place 1 drop into both eyes 2 (two) times daily as needed. For allergies    Historical Pablo Stauffer, MD  nitroGLYCERIN (NITROSTAT) 0.4 MG SL tablet Place 0.4 mg under the tongue every 5 (five) minutes as needed. For chest pain    Historical Jaxan Michel, MD  omeprazole (PRILOSEC) 20 MG capsule Take 20 mg by mouth 2 (two) times daily before a meal.    Historical Finas Delone, MD   Allergies  Allergen Reactions  . Fenofibrate Shortness Of Breath  . Lidocaine Anaphylaxis  . Procaine Hcl [Procaine Hcl] Anaphylaxis  . Atacand [Candesartan Cilexetil] Other (See Comments)    Severe chest pain  . Codeine  Other (See Comments)    Depression; tremors  . Iohexol      Code: HIVES, Desc: pt states that she gets hives from contrast.  stephanie davis, rt-r, Onset Date: [id number]   . Lyrica [Pregabalin] Other (See Comments)    Chest pain  . Neurontin [Gabapentin] Other (See Comments)    Chest pain  . Other     Weed killer sprays Rubber-smell causes runny eyes and coughing [employer]   . Prednisone     Quits breathing and runs up her blood sugar  . Ramipril Other (See Comments)    Severe cramps  . Soap     [employer] Dial soap  . Lodine [Etodolac] Rash  . Tetracyclines & Related Rash  . Vitamin D Analogs Rash    FAMILY HISTORY:  family history includes Cancer in her maternal grandfather and mother; Heart attack in her maternal grandfather; Heart disease in her father; Lung cancer in her mother. SOCIAL HISTORY:  reports that she has been smoking Cigarettes.  She has a 20 pack-year smoking history. She has never used smokeless tobacco. She reports that she does not drink alcohol or use illicit drugs.  REVIEW OF SYSTEMS:   As per HPI - All other systems reviewed and were neg.    SUBJECTIVE:   VITAL SIGNS: Temp:  [97.5 F (36.4 C)] 97.5 F (36.4  C) (10/06 1106) Pulse Rate:  [78-87] 78 (10/06 1215) Resp:  [15-18] 16 (10/06 1215) BP: (124-141)/(62-70) 141/64 mmHg (10/06 1215) SpO2:  [97 %-100 %] 99 % (10/06 1215) Weight:  [155 lb (70.308 kg)] 155 lb (70.308 kg) (10/06 1106)  PHYSICAL EXAMINATION: General:  Pleasant, frail female, NAD  Neuro:  Awake, alert, appropriate, MAE  HEENT:  Mm moist, no JVD  Cardiovascular:  s1s2 rrr Lungs:  resps even non labored on RA, diminished R>L base, few scattered crackles  Abdomen:  Round, soft, non tender  Musculoskeletal:  Warm and dry, R mid back palpable lymph node    Recent Labs Lab 03/20/15 1140  NA 132*  K 5.0  CL 95*  CO2 28  BUN 12  CREATININE 0.56  GLUCOSE 367*    Recent Labs Lab  03/20/15 1140  HGB 13.7  HCT 40.8  WBC 9.4  PLT 340   No results found.  ASSESSMENT / PLAN:  Lung mass - presumed primary lung ca with extensive metastasis.   Nausea/vomiting  Pain   Discussed with pt and her daughters at length.  She is not interested in any treatment other than palliative radiation and her main focus is being able to enjoy "whatever time" she has left.  She wants to focus on pain control and nausea management.  She wants to be at home and would like to look into hospice options.  She is no longer interested in pursuing EBUS or bx of any sort.    [place] for d/c home from pulmonary standpoint  Will refer to radiation oncology for possible palliative radiation to spine --** has appt 10/7 @1230pm  with rad oncology PRN zofran Pain management  Home hospice referral    Nickolas Madrid, NP 03/20/2015  2:22 PM Pager: [phone] or [phone]

## 2015-03-20 NOTE — ED Notes (Signed)
Pt told to come by PCP for eval by pulmonologist for increasing right sided back pain and N/V with decreased appetite

## 2015-03-20 NOTE — ED Provider Notes (Signed)
CSN: [id number]     Arrival date & time 03/20/15  1101 History   First MD Initiated Contact with Patient 03/20/15 1117     Chief Complaint  Patient presents with  . Emesis  . Back Pain     (Consider location/radiation/quality/duration/timing/severity/associated sxs/prior Treatment) HPI Comments: Patient is a 59 year old female with a past medical history of CAD, diabetes, hyperlipidemia, previous stroke, and previous breast cancer who presents with back pain that worsened since yesterday. Symptoms started gradually and progressively worsened since the onset. The pain is aching and severe and located in her right upper back. The pain does not radiate. No aggravating/alleviating factors. Patient was instructed to come to the ED by Dr. Lenna Gilford for further evaluation of a suspicious lung mass in the right lung. Patient reports associated decreased appetite and nausea with vomiting.    Past Medical History  Diagnosis Date  . PVD (peripheral vascular disease) ([place])   . Breast cancer ([place])     right breast, chemo and mastectomy  . CAD (coronary artery disease)     severe  . Tobacco abuse   . Diabetes mellitus   . Hyperlipidemia   . Stroke [facility])    Past Surgical History  Procedure Laterality Date  . Mastectomy    . Below knee leg amputation      right  . Femoral-popliteal bypass graft    . Tubal ligation    . Cholecystectomy    . Hernia repair    . Pr vein bypass graft,aorto-fem-pop    . Carotid endarterectomy  08/10/2007  . Cataract extraction  04/2012    Left eye  . Lower extremity angiogram Left 06/16/2011    Procedure: LOWER EXTREMITY ANGIOGRAM;  Surgeon: Serafina Mitchell, MD;  Location: [facility] CATH LAB;  Service: Cardiovascular;  Laterality: Left;  . Abdominal aortagram N/A 10/26/2011    Procedure: ABDOMINAL Maxcine Ham;  Surgeon: Serafina Mitchell, MD;  Location: [facility] CATH LAB;  Service: Cardiovascular;  Laterality: N/A;  . Abdominal aortagram N/A 02/19/2014    Procedure: ABDOMINAL AORTAGRAM;   Surgeon: Serafina Mitchell, MD;  Location: [facility] CATH LAB;  Service: Cardiovascular;  Laterality: N/A;   Family History  Problem Relation Age of Onset  . Lung cancer Mother   . Cancer Mother     Lung  . Heart disease Father   . Heart attack Maternal Grandfather   . Cancer Maternal Grandfather     Multiple Myloma   Social History  Substance Use Topics  . Smoking status: Current Some Day Smoker -- 0.50 packs/day for 40 years    Types: Cigarettes  . Smokeless tobacco: Never Used  . Alcohol Use: No   OB History    No data available     Review of Systems  Gastrointestinal: Positive for vomiting.  Musculoskeletal: Positive for back pain.  All other systems reviewed and are negative.     Allergies  Fenofibrate; Lidocaine; Procaine hcl; Atacand; Codeine; Iohexol; Lyrica; Neurontin; Other; Prednisone; Ramipril; Soap; Lodine; Tetracyclines & related; and Vitamin d analogs  Home Medications   Prior to Admission medications   Medication Sig Start Date End Date Taking? Authorizing Provider  ALPRAZolam (XANAX) 0.25 MG tablet Take 0.125 mg by mouth daily as needed for anxiety.    Historical Provider, MD  APIDRA SOLOSTAR 100 UNIT/ML injection Inject 4-16 Units into the skin See admin instructions. DAILY SLIDING SCALE based on cbg readings 10/21/10   Historical Provider, MD  aspirin EC 81 MG tablet Take 81 mg by  mouth daily.    Historical Provider, MD  atenolol (TENORMIN) 25 MG tablet Take 1 tablet (25 mg total) by mouth daily. 07/06/12 11/28/14  Josue Hector, MD  atenolol (TENORMIN) 25 MG tablet 25 mg daily. 01/14/15   Historical Provider, MD  diphenhydrAMINE (BENADRYL) 25 MG tablet Take 25 mg by mouth every 6 (six) hours as needed for allergies.    Historical Provider, MD  insulin NPH-insulin regular (HUMULIN 70/30) (70-30) 100 UNIT/ML injection Inject 55-65 Units into the skin 2 (two) times daily with a meal. 55 units in the morning 65 units at bedtime    Historical Provider, MD  ketotifen  (ALAWAY) 0.025 % ophthalmic solution Place 1 drop into both eyes 2 (two) times daily as needed. For allergies    Historical Provider, MD  nitroGLYCERIN (NITROSTAT) 0.4 MG SL tablet Place 0.4 mg under the tongue every 5 (five) minutes as needed. For chest pain    Historical Provider, MD  omeprazole (PRILOSEC) 20 MG capsule Take 20 mg by mouth 2 (two) times daily before a meal.    Historical Provider, MD  PROAIR HFA 108 (90 BASE) MCG/ACT inhaler 2 puffs every 4 (four) hours as needed. 01/14/15   Historical Provider, MD   BP 124/62 mmHg  Pulse 87  Temp(Src) 97.5 F (36.4 C) (Oral)  Resp 17  Ht 5\' 9"  (1.753 m)  Wt 155 lb (70.308 kg)  BMI 22.88 kg/m2  SpO2 100% Physical Exam  Constitutional: She is oriented to person, place, and time. She appears well-developed and well-nourished. No distress.  HENT:  Head: Normocephalic and atraumatic.  Eyes: Conjunctivae and EOM are normal.  Neck: Normal range of motion.  Cardiovascular: Normal rate and regular rhythm.  Exam reveals no gallop and no friction rub.   No murmur heard. Pulmonary/Chest: Effort normal and breath sounds normal. She has no wheezes. She has no rales. She exhibits no tenderness.  Abdominal: Soft. There is no tenderness.  Musculoskeletal: Normal range of motion.  Right BKA  Neurological: She is alert and oriented to person, place, and time.  Speech is goal-oriented. Moves limbs without ataxia.   Skin: Skin is warm and dry.  Psychiatric: She has a normal mood and affect. Her behavior is normal.  Nursing note and vitals reviewed.   ED Course  Procedures (including critical care time) Labs Review Labs Reviewed  LIPASE, BLOOD - Abnormal; Notable for the following:    Lipase 18 (*)    All other components within normal limits  COMPREHENSIVE METABOLIC PANEL - Abnormal; Notable for the following:    Sodium 132 (*)    Chloride 95 (*)    Glucose, Bld 367 (*)    ALT 11 (*)    Alkaline Phosphatase 138 (*)    Total Bilirubin 1.4  (*)    All other components within normal limits  CBC  URINALYSIS, ROUTINE W REFLEX MICROSCOPIC (NOT AT [facility])  I-STAT TROPOININ, ED    Imaging Review No results found. I have personally reviewed and evaluated these images and lab results as part of my medical decision-making.   EKG Interpretation None      MDM   Final diagnoses:  Generalized pain  Non-intractable vomiting without nausea, vomiting of unspecified type    11:19 AM Patient will have labs and be seen by pulmonology.   Patient seen by Pulmonology and advised discharged home and office follow up. No further evaluation needed at this time.    Alvina Chou, PA-C 03/20/15 [place], MD  03/21/15 2202 

## 2015-03-20 NOTE — Telephone Encounter (Signed)
SN please advise. Thanks.  

## 2015-03-20 NOTE — ED Notes (Signed)
Dr. Lenna Gilford called and informed [name] that pt. Would be arriving and for [place] to call the Pulmonologist on call which is Dr. Milinda Hirschfeld and the Pulmonologist PA.  Brittney P, Medical Secretary will be paging them.

## 2015-03-21 ENCOUNTER — Telehealth: Payer: Self-pay | Admitting: Pulmonary Disease

## 2015-03-21 ENCOUNTER — Ambulatory Visit
Admission: RE | Admit: 2015-03-21 | Discharge: 2015-03-21 | Disposition: A | Discharge status: Home / Self Care | Payer: Medicare Other | Source: Ambulatory Visit | Attending: Radiation Oncology | Admitting: Radiation Oncology

## 2015-03-21 ENCOUNTER — Ambulatory Visit
Admit: 2015-03-21 | Discharge: 2015-03-21 | Disposition: A | Discharge status: Home / Self Care | Payer: Medicare Other | Attending: Radiation Oncology | Admitting: Radiation Oncology

## 2015-03-21 ENCOUNTER — Ambulatory Visit: Payer: Medicare Other

## 2015-03-21 DIAGNOSIS — J449 Chronic obstructive pulmonary disease, unspecified: Secondary | ICD-10-CM

## 2015-03-21 NOTE — Progress Notes (Deleted)
Histology and Location of Primary Cancer:  1. Infiltrative hypermetabolic 3.5 x 2.4 cm central right lung mass centered in the central right middle lobe, which narrows the right middle lobe bronchus and is suspicious for a primary lung malignancy. Persistent postobstructive pneumonia and atelectasis in the right middle lobe. 2. Central right lung mass is confluent with hypermetabolic metastatic right hilar, subcarinal and right paratracheal lymphadenopathy, which involves the carina. 3. Hypermetabolic right supraclavicular metastatic lymph node. 4. Subsolid bilateral upper lobe subcentimeter pulmonary nodules, below PET resolution, cannot exclude pulmonary metastases. 5. Hypermetabolic left adrenal metastasis.  Sites of Visceral and Bony Metastatic Disease:  6. 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  Location(s) of Symptomatic Metastases:   Past/Anticipated chemotherapy by medical oncology, if any: [name] has declined any chemotherapy at this time, opting for palliative radiation only.  Pain on a scale of 0-10 is:   If Spine Met(s), symptoms, if any, include:  Bowel/Bladder retention or incontinence (please describe):   Numbness or weakness in extremities (please describe):  Current Decadron regimen, if applicable:   Ambulatory status? Walker? Wheelchair?:   SAFETY ISSUES:  Prior radiation?   Pacemaker/ICD?   Possible current pregnancy? No  Is the patient on methotrexate?   Current Complaints / other details:

## 2015-03-21 NOTE — Telephone Encounter (Signed)
Spoke with pt's daughter, states that SN discussed hospice referral with her yesterday, notes that pt needs [facility].    SN please advise.  Thanks!

## 2015-03-21 NOTE — Telephone Encounter (Signed)
Per SN >> ok to refer to [facility].  Pt is aware of that we will refer her to Hospice. Nothing further was needed.

## 2015-05-15 DEATH — deceased
